# Patient Record
Sex: Male | Born: 1950 | Race: White | Hispanic: No | Smoking: Current every day smoker
Health system: Southern US, Community
[De-identification: ages and names within clinical notes are randomized; demographics above are authoritative.]

## PROBLEM LIST (undated history)

## (undated) DIAGNOSIS — I872 Venous insufficiency (chronic) (peripheral): Secondary | ICD-10-CM

## (undated) DIAGNOSIS — E785 Hyperlipidemia, unspecified: Secondary | ICD-10-CM

## (undated) DIAGNOSIS — I2510 Atherosclerotic heart disease of native coronary artery without angina pectoris: Secondary | ICD-10-CM

## (undated) DIAGNOSIS — I509 Heart failure, unspecified: Secondary | ICD-10-CM

## (undated) DIAGNOSIS — D649 Anemia, unspecified: Secondary | ICD-10-CM

## (undated) DIAGNOSIS — Z9889 Other specified postprocedural states: Secondary | ICD-10-CM

## (undated) DIAGNOSIS — E119 Type 2 diabetes mellitus without complications: Secondary | ICD-10-CM

## (undated) DIAGNOSIS — R112 Nausea with vomiting, unspecified: Secondary | ICD-10-CM

## (undated) DIAGNOSIS — K219 Gastro-esophageal reflux disease without esophagitis: Secondary | ICD-10-CM

## (undated) DIAGNOSIS — F419 Anxiety disorder, unspecified: Secondary | ICD-10-CM

## (undated) DIAGNOSIS — J189 Pneumonia, unspecified organism: Secondary | ICD-10-CM

## (undated) DIAGNOSIS — I739 Peripheral vascular disease, unspecified: Secondary | ICD-10-CM

## (undated) DIAGNOSIS — I219 Acute myocardial infarction, unspecified: Secondary | ICD-10-CM

## (undated) DIAGNOSIS — G709 Myoneural disorder, unspecified: Secondary | ICD-10-CM

## (undated) DIAGNOSIS — I10 Essential (primary) hypertension: Secondary | ICD-10-CM

## (undated) HISTORY — DX: Essential (primary) hypertension: I10

## (undated) HISTORY — DX: Hyperlipidemia, unspecified: E78.5

## (undated) HISTORY — PX: ROTATOR CUFF REPAIR: SHX139

## (undated) HISTORY — DX: Venous insufficiency (chronic) (peripheral): I87.2

## (undated) HISTORY — PX: APPENDECTOMY: SHX54

## (undated) HISTORY — PX: CORONARY ANGIOPLASTY: SHX604

## (undated) HISTORY — DX: Type 2 diabetes mellitus without complications: E11.9

## (undated) HISTORY — DX: Heart failure, unspecified: I50.9

---

## 2006-11-23 HISTORY — PX: CORONARY ARTERY BYPASS GRAFT: SHX141

## 2007-12-14 ENCOUNTER — Ambulatory Visit: Payer: Self-pay | Admitting: Thoracic Surgery (Cardiothoracic Vascular Surgery)

## 2008-01-31 ENCOUNTER — Ambulatory Visit: Payer: Self-pay | Admitting: Thoracic Surgery (Cardiothoracic Vascular Surgery)

## 2008-03-06 ENCOUNTER — Ambulatory Visit: Payer: Self-pay | Admitting: Thoracic Surgery (Cardiothoracic Vascular Surgery)

## 2014-06-21 ENCOUNTER — Other Ambulatory Visit: Payer: Self-pay | Admitting: *Deleted

## 2014-06-21 DIAGNOSIS — R0989 Other specified symptoms and signs involving the circulatory and respiratory systems: Secondary | ICD-10-CM

## 2014-06-22 ENCOUNTER — Encounter: Payer: Self-pay | Admitting: Surgery

## 2014-06-23 ENCOUNTER — Emergency Department (HOSPITAL_COMMUNITY)
Admission: EM | Admit: 2014-06-23 | Discharge: 2014-06-23 | Disposition: A | Discharge status: Home / Self Care | Payer: Medicare HMO | Attending: Emergency Medicine | Admitting: Emergency Medicine

## 2014-06-23 ENCOUNTER — Encounter (HOSPITAL_COMMUNITY): Payer: Self-pay | Admitting: Emergency Medicine

## 2014-06-23 DIAGNOSIS — I739 Peripheral vascular disease, unspecified: Secondary | ICD-10-CM

## 2014-06-23 DIAGNOSIS — E785 Hyperlipidemia, unspecified: Secondary | ICD-10-CM | POA: Diagnosis not present

## 2014-06-23 DIAGNOSIS — M79609 Pain in unspecified limb: Secondary | ICD-10-CM | POA: Insufficient documentation

## 2014-06-23 DIAGNOSIS — F17200 Nicotine dependence, unspecified, uncomplicated: Secondary | ICD-10-CM | POA: Diagnosis not present

## 2014-06-23 DIAGNOSIS — I743 Embolism and thrombosis of arteries of the lower extremities: Secondary | ICD-10-CM | POA: Insufficient documentation

## 2014-06-23 DIAGNOSIS — R21 Rash and other nonspecific skin eruption: Secondary | ICD-10-CM | POA: Diagnosis not present

## 2014-06-23 DIAGNOSIS — Z9861 Coronary angioplasty status: Secondary | ICD-10-CM | POA: Insufficient documentation

## 2014-06-23 DIAGNOSIS — E119 Type 2 diabetes mellitus without complications: Secondary | ICD-10-CM | POA: Diagnosis not present

## 2014-06-23 DIAGNOSIS — I8310 Varicose veins of unspecified lower extremity with inflammation: Secondary | ICD-10-CM | POA: Diagnosis not present

## 2014-06-23 DIAGNOSIS — I7090 Unspecified atherosclerosis: Secondary | ICD-10-CM

## 2014-06-23 DIAGNOSIS — I2510 Atherosclerotic heart disease of native coronary artery without angina pectoris: Secondary | ICD-10-CM | POA: Insufficient documentation

## 2014-06-23 DIAGNOSIS — I509 Heart failure, unspecified: Secondary | ICD-10-CM | POA: Insufficient documentation

## 2014-06-23 DIAGNOSIS — Z79899 Other long term (current) drug therapy: Secondary | ICD-10-CM | POA: Insufficient documentation

## 2014-06-23 DIAGNOSIS — I771 Stricture of artery: Secondary | ICD-10-CM

## 2014-06-23 DIAGNOSIS — I10 Essential (primary) hypertension: Secondary | ICD-10-CM | POA: Diagnosis not present

## 2014-06-23 LAB — CBC WITH DIFFERENTIAL/PLATELET
BASOS PCT: 1 % (ref 0–1)
Basophils Absolute: 0.1 10*3/uL (ref 0.0–0.1)
EOS ABS: 0.1 10*3/uL (ref 0.0–0.7)
Eosinophils Relative: 2 % (ref 0–5)
HCT: 42.3 % (ref 39.0–52.0)
Hemoglobin: 14.7 g/dL (ref 13.0–17.0)
LYMPHS ABS: 1.4 10*3/uL (ref 0.7–4.0)
Lymphocytes Relative: 23 % (ref 12–46)
MCH: 31.3 pg (ref 26.0–34.0)
MCHC: 34.8 g/dL (ref 30.0–36.0)
MCV: 90.2 fL (ref 78.0–100.0)
Monocytes Absolute: 0.4 10*3/uL (ref 0.1–1.0)
Monocytes Relative: 7 % (ref 3–12)
Neutro Abs: 4.2 10*3/uL (ref 1.7–7.7)
Neutrophils Relative %: 67 % (ref 43–77)
PLATELETS: 207 10*3/uL (ref 150–400)
RBC: 4.69 MIL/uL (ref 4.22–5.81)
RDW: 13.6 % (ref 11.5–15.5)
WBC: 6.2 10*3/uL (ref 4.0–10.5)

## 2014-06-23 LAB — BASIC METABOLIC PANEL
Anion gap: 13 (ref 5–15)
BUN: 17 mg/dL (ref 6–23)
CALCIUM: 9.1 mg/dL (ref 8.4–10.5)
CO2: 24 mEq/L (ref 19–32)
Chloride: 97 mEq/L (ref 96–112)
Creatinine, Ser: 1.25 mg/dL (ref 0.50–1.35)
GFR calc Af Amer: 70 mL/min — ABNORMAL LOW (ref >90)
GFR calc non Af Amer: 60 mL/min — ABNORMAL LOW (ref >90)
GLUCOSE: 239 mg/dL — AB (ref 70–99)
Potassium: 4 mEq/L (ref 3.7–5.3)
SODIUM: 134 meq/L — AB (ref 137–147)

## 2014-06-23 LAB — I-STAT CG4 LACTIC ACID, ED: Lactic Acid, Venous: 1.05 mmol/L (ref 0.5–2.2)

## 2014-06-23 MED ORDER — OXYCODONE-ACETAMINOPHEN 10-325 MG PO TABS: 0.5000 | ORAL_TABLET | ORAL | Status: DC | PRN

## 2014-06-23 NOTE — ED Provider Notes (Signed)
Plains of right leg and foot pain for several weeks. Worse with walking. Improved with rest. Recently treated for cellulitis. On exam patient is alert nontoxic. Chronically ill-appearing. Right lower extremity reddened, not warm or cool in comparison to contralateral leg.dp and pt pulses absent bilaterally. Vascular surgery consultation called.Dr [name] saw pt in the ED And made arragements for further outpt evaulaution  Doug SouSam Brylinn Teaney, MD 06/23/14 1046

## 2014-06-23 NOTE — ED Provider Notes (Signed)
CSN: [id number]     Arrival date & time 06/23/14  0830 History   First MD Initiated Contact with Patient 06/23/14 0840     Chief Complaint  Patient presents with  . Foot Pain     (Consider location/radiation/quality/duration/timing/severity/associated sxs/prior Treatment) HPI  Brent Patterson is a(n) 63 y.o. male who presents to the ED with cc right foot pain. The patient has a past medical history of diabetes, hypertension, hyperlipidemia, coronary artery disease who is status post stent placement. The patient complains of pain in the right foot. He states that for the past 2 months she's been treated for cellulitis with 4 rounds of antibiotics. The patient states that 2 months ago he cut then toenail of his right great toe and had the development of swelling, pain, heat, redness. He was seen at [facility] and treated for cellulitis then. He he states that they did do a study to look for clot that was negative. Since that time he has had worsening pain in the legs bilaterally however the right is worse. He has a history of pain with walking that is relieved with rest. The patient states that over the past month it has gotten significantly worse. He states that he used to be able to walk around his house with a cane and only have to stop intermittently for relief. He states that now he has to stop halfway down his hall to rest the chest pain. The patient was seen on 06/19/2014) ER again and diagnosed with venous stasis dermatitis. He is asked to followup with vascular surgery. The patient states he has been taking hydrocodone with moderate relief of his pain. He states that this morning at 3 AM he awoke to severe pain and in the right foot. Patient states he took one of his hydrocodone however it did not help with his pain. He has decreased sensation due to diabetic neuropathy.  Current Daily Smoker Denies fevers, chills, myalgias, arthralgias. Denies DOE, SOB, chest tightness or pressure,  radiation to left arm, jaw or back, or diaphoresis. Denies dysuria, flank pain, suprapubic pain, frequency, urgency, or hematuria. Denies headaches, light headedness, weakness, visual disturbances. Denies abdominal pain, nausea, vomiting, diarrhea or constipation.   Past Medical History  Diagnosis Date  . Diabetes mellitus without complication   . Hypertension   . Hyperlipidemia   . CHF (congestive heart failure)   . Dermatitis, stasis    History reviewed. No pertinent past surgical history. No family history on file. History  Substance Use Topics  . Smoking status: Current Every Day Smoker    Types: Cigarettes  . Smokeless tobacco: Not on file  . Alcohol Use: No    Review of Systems  Ten systems reviewed and are negative for acute change, except as noted in the HPI.    Allergies  Codeine  Home Medications   Prior to Admission medications   Medication Sig Start Date End Date Taking? Authorizing Provider  amLODipine (NORVASC) 10 MG tablet Take 10 mg by mouth every morning.    Yes Historical Provider, MD  escitalopram (LEXAPRO) 10 MG tablet Take 10 mg by mouth every evening.    Yes Historical Provider, MD  gabapentin (NEURONTIN) 100 MG capsule Take 200 mg by mouth 2 (two) times daily.   Yes Historical Provider, MD  glimepiride (AMARYL) 2 MG tablet Take 1 mg by mouth every evening.   Yes Historical Provider, MD  HYDROcodone-acetaminophen (NORCO/VICODIN) 5-325 MG per tablet Take 1 tablet by mouth every 4 (four)  hours as needed for moderate pain.   Yes Historical Provider, MD  lisinopril (PRINIVIL,ZESTRIL) 20 MG tablet Take 20 mg by mouth every evening.    Yes Historical Provider, MD  nitroGLYCERIN (NITROSTAT) 0.4 MG SL tablet Place 0.4 mg under the tongue every 5 (five) minutes as needed for chest pain.   Yes Historical Provider, MD  pravastatin (PRAVACHOL) 40 MG tablet Take 40 mg by mouth every evening.    Yes Historical Provider, MD  ranitidine (ZANTAC) 150 MG tablet Take 150  mg by mouth daily as needed for heartburn.    Yes Historical Provider, MD  sitaGLIPtin (JANUVIA) 100 MG tablet Take 50 mg by mouth every evening.   Yes Historical Provider, MD   BP 134/82  Pulse 113  Temp(Src) 98 F (36.7 C) (Oral)  Resp 16  SpO2 100% Physical Exam  Nursing note and vitals reviewed. Constitutional: No distress.  Appears olders than stated age.   HENT:  Head: Normocephalic and atraumatic.  Eyes: Conjunctivae are normal. No scleral icterus.  Neck: Normal range of motion. Neck supple.  Cardiovascular: Normal rate, regular rhythm and normal heart sounds.   Right and left lower extremity examination without palpable pulses. Doppler reveals a intermittent faint monophasic right foot dorsalis pedis pulse. No other pulses are audible with Doppler examination. Right foot shows a petechial rash which coalesces into for reddened discoloration of the right foot. No temperature differential is noted. Patient is able to move his toes. Capillary refill of the right foot is greater than 3 seconds. Sensation is poor. No palpable popliteal pulses bilaterally appear.  Pulmonary/Chest: Effort normal. No respiratory distress.  Abdominal: Soft. There is no tenderness.  Musculoskeletal: He exhibits no edema.  Neurological: He is alert.  Skin: Skin is warm and dry. Rash noted. He is not diaphoretic.  Psychiatric: His behavior is normal.    ED Course  Procedures (including critical care time) Labs Review Labs Reviewed  CBC WITH DIFFERENTIAL  BASIC METABOLIC PANEL  I-STAT CG4 LACTIC ACID, ED    Imaging Review No results found.   EKG Interpretation None      MDM   Final diagnoses:  None    9:49 AM BP 134/82  Pulse 113  Temp(Src) 98 F (36.7 C) (Oral)  Resp 16  SpO2 100% GU with complaint of right leg pain. Examination and visit with Dr. Rennis Chris. I have ordered a release of his medical records from [facility]. Pain medication and labs ordered. Also place a  consult with the vascular surgeons. Patient states that he did speak with the staff at Dr. Estanislado Spire office 2 days ago and was told to have it evaluated immediately because he was unable to get an appointment until August 31. Patient states he had to get a ride to come here.    10:31 AM BP 134/82  Pulse 113  Temp(Src) 98 F (36.7 C) (Oral)  Resp 16  SpO2 100% Seen hereby Dr. Darrick Penna. The patient will complete his vasc US   Filed Vitals:   06/23/14 0913 06/23/14 1043 06/23/14 1100 06/23/14 1145  BP: 134/82 153/74 158/66 133/61  Pulse: 113 103 82 84  Temp: 98 F (36.7 C)     TempSrc: Oral     Resp: 16 16    SpO2: 100% 100% 99% 99%     Patient with complete occlusion of the arterial system from the Common Illiac  Distally. Patient has a scheduled arteriogram with Dr. Myra Gianotti on Tuesday. D/c with percocet.    Cammy Copa  Tiburcio Pea, PA-C 06/23/14 1918

## 2014-06-23 NOTE — Discharge Instructions (Signed)

## 2014-06-23 NOTE — ED Notes (Signed)
Pt. Stated, I have rt. Foot pain for 2 months and they've been treating me for cellulitis, and Im suppose to see vein specialist on the 31st of August.

## 2014-06-23 NOTE — Progress Notes (Signed)
VASCULAR LAB PRELIMINARY  ARTERIAL  ABI completed:    RIGHT    LEFT    PRESSURE WAVEFORM  PRESSURE WAVEFORM  BRACHIAL 188 triphasic BRACHIAL    DP  absent DP    AT  absent AT 80 monophasic  PT  absent PT 89 monophasic  PER  absent PER    GREAT TOE  flat GREAT TOE  flat    RIGHT LEFT  ABI N/A 0.47   Duplex imaging:  Right:  The common femoral, femoral, popliteal, peroneal, and posterior tibial arteries appear occluded.  Left:  The femoral artery appears occluded proximally with reconstitution in the popliteal artery.    Biviana Saddler, RVT 06/23/2014, 11:36 AM

## 2014-06-23 NOTE — ED Notes (Signed)
Lactic acid results given to Harris, PA-C 

## 2014-06-23 NOTE — Consult Note (Signed)
[facility] [place] HISTORY AND PHYSICAL  Reason for consult: right foot pain Requesting: [place] Jacobowitz History of Present Illness:  Patient is a 63 y.o. year old male who presents for evaluation of pain right foot. The pain has been present for approximately 2 months.  He has had some relief with Vicodin.  He has been seen several times at [facility] and treated for cellulitis with antibiotics with no improvement.  He has a long standing history of cramping in the right leg with walking which improves with rest.  He has no history of non healing wounds.  He has some numbness and tingling both feet R > L.  Risk factors include coronary artery disease (CABG 2008 right leg vein), diabetes >10 years, hypertension, +smoking (1.5 ppd) hyperlipidemia, CHF.  These problems are currently stable.  Greater than 3 minutes spent regarding smoking cessation counseling.    Past Medical History  Diagnosis Date  . Diabetes mellitus without complication   . Hypertension   . Hyperlipidemia   . CHF (congestive heart failure)   . Dermatitis, stasis     History reviewed. No pertinent past surgical history.  Social History History  Substance Use Topics  . Smoking status: Current Every Day Smoker    Types: Cigarettes  . Smokeless tobacco: Not on file  . Alcohol Use: No    Family History No family history on file.  Allergies  Allergies  Allergen Reactions  . Codeine Other (See Comments)    Chills "Made me sick to my stomach"     No current facility-administered medications for this encounter.   Current Outpatient Prescriptions  Medication Sig Dispense Refill  . amLODipine (NORVASC) 10 MG tablet Take 10 mg by mouth every morning.       . escitalopram (LEXAPRO) 10 MG tablet Take 10 mg by mouth every evening.       . gabapentin (NEURONTIN) 100 MG capsule Take 200 mg by mouth 2 (two) times daily.      Marland Kitchen glimepiride (AMARYL) 2 MG tablet Take 1 mg by mouth every  evening.      Marland Kitchen HYDROcodone-acetaminophen (NORCO/VICODIN) 5-325 MG per tablet Take 1 tablet by mouth every 4 (four) hours as needed for moderate pain.      Marland Kitchen lisinopril (PRINIVIL,ZESTRIL) 20 MG tablet Take 20 mg by mouth every evening.       . nitroGLYCERIN (NITROSTAT) 0.4 MG SL tablet Place 0.4 mg under the tongue every 5 (five) minutes as needed for chest pain.      . pravastatin (PRAVACHOL) 40 MG tablet Take 40 mg by mouth every evening.       . ranitidine (ZANTAC) 150 MG tablet Take 150 mg by mouth daily as needed for heartburn.       . sitaGLIPtin (JANUVIA) 100 MG tablet Take 50 mg by mouth every evening.        ROS:   General:  No weight loss, Fever, chills  HEENT: No recent headaches, no nasal bleeding, no visual changes, no sore throat  Neurologic: No dizziness, blackouts, seizures. No recent symptoms of stroke or mini- stroke. No recent episodes of slurred speech, or temporary blindness.  Cardiac: No recent episodes of chest pain/pressure, no shortness of breath at rest.  +shortness of breath with exertion.  Denies history of atrial fibrillation or irregular heartbeat  Vascular: + history of rest pain in feet.  + history of claudication.  No history of non-healing ulcer, No history of DVT  Pulmonary: No home oxygen, no productive cough, no hemoptysis,  No asthma or wheezing  Musculoskeletal:  [ ]  Arthritis, [ ]  Low back pain,  [ ]  Joint pain  Hematologic:No history of hypercoagulable state.  No history of easy bleeding.  No history of anemia  Gastrointestinal: No hematochezia or melena,  No gastroesophageal reflux, no trouble swallowing  Urinary: [ ]  chronic Kidney disease, [ ]  on HD - [ ]  MWF or [ ]  TTHS, [ ]  Burning with urination, [ ]  Frequent urination, [ ]  Difficulty urinating;   Skin: No rashes  Psychological: No history of anxiety,  No history of depression   Physical Examination  Filed Vitals:   06/23/14 0837 06/23/14 0913  BP: 177/93 134/82  Pulse: 115 113   Temp: 97.2 F (36.2 C) 98 F (36.7 C)  TempSrc: Oral Oral  Resp: 22 16  SpO2: 97% 100%    There is no height or weight on file to calculate BMI.  General:  Alert and oriented, no acute distress HEENT: Normal Neck: No JVD, 2+ carotid pulses Pulmonary: non labored breathing Cardiac: Regular Rate and Rhythm Abdomen: Soft, non-tender, non-distended, no mass Skin: petechial rash right foot extending to ankle reddish color, brawny staining left gaiter area Extremity Pulses:  2+ radial, brachial, absent right femoral 2+ left femoral, absent popliteal dorsalis pedis, posterior tibial pulses bilaterally Musculoskeletal: No deformity or edema  Neurologic: Upper and lower extremity motor 5/5 and symmetric  DATA:  ABI arterial duplex pending  CBC    Component Value Date/Time   WBC 6.2 06/23/2014 0929   RBC 4.69 06/23/2014 0929   HGB 14.7 06/23/2014 0929   HCT 42.3 06/23/2014 0929   PLT 207 06/23/2014 0929   MCV 90.2 06/23/2014 0929   MCH 31.3 06/23/2014 0929   MCHC 34.8 06/23/2014 0929   RDW 13.6 06/23/2014 0929   LYMPHSABS 1.4 06/23/2014 0929   MONOABS 0.4 06/23/2014 0929   EOSABS 0.1 06/23/2014 0929   BASOSABS 0.1 06/23/2014 0929    BMET    Component Value Date/Time   NA 134* 06/23/2014 0929   K 4.0 06/23/2014 0929   CL 97 06/23/2014 0929   CO2 24 06/23/2014 0929   GLUCOSE 239* 06/23/2014 0929   BUN 17 06/23/2014 0929   CREATININE 1.25 06/23/2014 0929   CALCIUM 9.1 06/23/2014 0929   GFRNONAA 60* 06/23/2014 0929   GFRAA 70* 06/23/2014 0929    ASSESSMENT:  Peripheral arterial disease most likely right iliac artery occlusion by exam most likely SFA and tibial disease as well.  Although perfusion is poor this is a chronic not acute presentation.  Will schedule for aortogram with lower extremity runoff possible intervention by my partner Dr Myra GianottiBrabham on Tuesday August 4.  Our office will contact him Monday to discuss arrival times.  Can be dc'd home today after ABI and arterial doppler completed.  Will need some  additional pain medication to last until arteriogram   PLAN:  See above  Fabienne Brunsharles Nickolaus Bordelon, MD [facility] Office: [phone] Pager: [phone]

## 2014-06-24 NOTE — ED Provider Notes (Signed)
Medical screening examination/treatment/procedure(s) were conducted as a shared visit with non-physician practitioner(s) and myself.  I personally evaluated the patient during the encounter.   EKG Interpretation None       Garo Heidelberg, MD 06/24/14 0701 

## 2014-06-25 ENCOUNTER — Other Ambulatory Visit: Payer: Self-pay

## 2014-06-26 ENCOUNTER — Encounter (HOSPITAL_COMMUNITY)
Admission: RE | Disposition: A | Discharge status: Home / Self Care | Payer: Self-pay | Source: Ambulatory Visit | Attending: Surgery

## 2014-06-26 ENCOUNTER — Ambulatory Visit (HOSPITAL_COMMUNITY)
Admission: RE | Admit: 2014-06-26 | Discharge: 2014-06-26 | Disposition: A | Discharge status: Home / Self Care | Payer: Medicare HMO | Source: Ambulatory Visit | Attending: Surgery | Admitting: Surgery

## 2014-06-26 DIAGNOSIS — I509 Heart failure, unspecified: Secondary | ICD-10-CM | POA: Diagnosis not present

## 2014-06-26 DIAGNOSIS — E119 Type 2 diabetes mellitus without complications: Secondary | ICD-10-CM | POA: Insufficient documentation

## 2014-06-26 DIAGNOSIS — E785 Hyperlipidemia, unspecified: Secondary | ICD-10-CM | POA: Insufficient documentation

## 2014-06-26 DIAGNOSIS — I2510 Atherosclerotic heart disease of native coronary artery without angina pectoris: Secondary | ICD-10-CM | POA: Insufficient documentation

## 2014-06-26 DIAGNOSIS — I10 Essential (primary) hypertension: Secondary | ICD-10-CM | POA: Insufficient documentation

## 2014-06-26 DIAGNOSIS — F17200 Nicotine dependence, unspecified, uncomplicated: Secondary | ICD-10-CM | POA: Diagnosis not present

## 2014-06-26 DIAGNOSIS — Z951 Presence of aortocoronary bypass graft: Secondary | ICD-10-CM | POA: Insufficient documentation

## 2014-06-26 DIAGNOSIS — Z79899 Other long term (current) drug therapy: Secondary | ICD-10-CM | POA: Diagnosis not present

## 2014-06-26 DIAGNOSIS — I70219 Atherosclerosis of native arteries of extremities with intermittent claudication, unspecified extremity: Secondary | ICD-10-CM | POA: Diagnosis not present

## 2014-06-26 DIAGNOSIS — I739 Peripheral vascular disease, unspecified: Secondary | ICD-10-CM | POA: Diagnosis present

## 2014-06-26 DIAGNOSIS — I701 Atherosclerosis of renal artery: Secondary | ICD-10-CM | POA: Insufficient documentation

## 2014-06-26 HISTORY — PX: ABDOMINAL AORTAGRAM: SHX5454

## 2014-06-26 LAB — POCT I-STAT, CHEM 8
BUN: 14 mg/dL (ref 6–23)
CREATININE: 1.3 mg/dL (ref 0.50–1.35)
Calcium, Ion: 1.19 mmol/L (ref 1.13–1.30)
Chloride: 99 mEq/L (ref 96–112)
Glucose, Bld: 142 mg/dL — ABNORMAL HIGH (ref 70–99)
HCT: 45 % (ref 39.0–52.0)
Hemoglobin: 15.3 g/dL (ref 13.0–17.0)
Potassium: 4.2 mEq/L (ref 3.7–5.3)
Sodium: 139 mEq/L (ref 137–147)
TCO2: 24 mmol/L (ref 0–100)

## 2014-06-26 LAB — GLUCOSE, CAPILLARY: Glucose-Capillary: 125 mg/dL — ABNORMAL HIGH (ref 70–99)

## 2014-06-26 SURGERY — ABDOMINAL AORTAGRAM
Anesthesia: LOCAL

## 2014-06-26 MED ORDER — MIDAZOLAM HCL 2 MG/2ML IJ SOLN
INTRAMUSCULAR | Status: AC
  Filled 2014-06-26: qty 2

## 2014-06-26 MED ORDER — SODIUM CHLORIDE 0.9 % IV SOLN: 1.0000 mL/kg/h | INTRAVENOUS | Status: DC

## 2014-06-26 MED ORDER — HYDRALAZINE HCL 20 MG/ML IJ SOLN: 10.0000 mg | INTRAMUSCULAR | Status: DC | PRN

## 2014-06-26 MED ORDER — ALUM & MAG HYDROXIDE-SIMETH 200-200-20 MG/5ML PO SUSP: 15.0000 mL | ORAL | Status: DC | PRN

## 2014-06-26 MED ORDER — GUAIFENESIN-DM 100-10 MG/5ML PO SYRP: 15.0000 mL | ORAL_SOLUTION | ORAL | Status: DC | PRN

## 2014-06-26 MED ORDER — FENTANYL CITRATE 0.05 MG/ML IJ SOLN
INTRAMUSCULAR | Status: AC
  Filled 2014-06-26: qty 2

## 2014-06-26 MED ORDER — OXYCODONE HCL 5 MG PO TABS
5.0000 mg | ORAL_TABLET | ORAL | Status: DC | PRN
  Administered 2014-06-26: 5 mg via ORAL

## 2014-06-26 MED ORDER — ACETAMINOPHEN 325 MG PO TABS: 325.0000 mg | ORAL_TABLET | ORAL | Status: DC | PRN

## 2014-06-26 MED ORDER — LIDOCAINE HCL (PF) 1 % IJ SOLN
INTRAMUSCULAR | Status: AC
  Filled 2014-06-26: qty 30

## 2014-06-26 MED ORDER — SODIUM CHLORIDE 0.9 % IV SOLN
INTRAVENOUS | Status: DC
  Administered 2014-06-26: 06:00:00 via INTRAVENOUS

## 2014-06-26 MED ORDER — MORPHINE SULFATE 10 MG/ML IJ SOLN
2.0000 mg | INTRAMUSCULAR | Status: DC | PRN
  Administered 2014-06-26: 2 mg via INTRAVENOUS

## 2014-06-26 MED ORDER — ONDANSETRON HCL 4 MG/2ML IJ SOLN
INTRAMUSCULAR | Status: AC
  Filled 2014-06-26: qty 2

## 2014-06-26 MED ORDER — LABETALOL HCL 5 MG/ML IV SOLN: 10.0000 mg | INTRAVENOUS | Status: DC | PRN

## 2014-06-26 MED ORDER — METOPROLOL TARTRATE 1 MG/ML IV SOLN: 2.0000 mg | INTRAVENOUS | Status: DC | PRN

## 2014-06-26 MED ORDER — HEPARIN (PORCINE) IN NACL 2-0.9 UNIT/ML-% IJ SOLN
INTRAMUSCULAR | Status: AC
  Filled 2014-06-26: qty 1000

## 2014-06-26 MED ORDER — MORPHINE SULFATE 2 MG/ML IJ SOLN
INTRAMUSCULAR | Status: AC
  Filled 2014-06-26: qty 1

## 2014-06-26 MED ORDER — ACETAMINOPHEN 325 MG RE SUPP: 325.0000 mg | RECTAL | Status: DC | PRN

## 2014-06-26 MED ORDER — ONDANSETRON HCL 4 MG/2ML IJ SOLN
4.0000 mg | Freq: Once | INTRAMUSCULAR | Status: AC
  Administered 2014-06-26: 4 mg via INTRAVENOUS

## 2014-06-26 MED ORDER — ONDANSETRON HCL 4 MG/2ML IJ SOLN: 4.0000 mg | Freq: Four times a day (QID) | INTRAMUSCULAR | Status: DC | PRN

## 2014-06-26 MED ORDER — OXYCODONE HCL 5 MG PO TABS
ORAL_TABLET | ORAL | Status: AC
  Filled 2014-06-26: qty 1

## 2014-06-26 MED ORDER — PHENOL 1.4 % MT LIQD: 1.0000 | OROMUCOSAL | Status: DC | PRN

## 2014-06-26 SURGICAL SUPPLY — 53 items
BANDAGE ELASTIC 4 VELCRO ST LF (GAUZE/BANDAGES/DRESSINGS) IMPLANT
BANDAGE ESMARK 6X9 LF (GAUZE/BANDAGES/DRESSINGS) IMPLANT
BNDG ESMARK 6X9 LF (GAUZE/BANDAGES/DRESSINGS)
CANISTER SUCTION 2500CC (MISCELLANEOUS) ×3 IMPLANT
CLIP TI MEDIUM 24 (CLIP) ×3 IMPLANT
CLIP TI WIDE RED SMALL 24 (CLIP) ×3 IMPLANT
COVER SURGICAL LIGHT HANDLE (MISCELLANEOUS) ×3 IMPLANT
CUFF TOURNIQUET SINGLE 24IN (TOURNIQUET CUFF) IMPLANT
CUFF TOURNIQUET SINGLE 34IN LL (TOURNIQUET CUFF) IMPLANT
CUFF TOURNIQUET SINGLE 44IN (TOURNIQUET CUFF) IMPLANT
DERMABOND ADVANCED (GAUZE/BANDAGES/DRESSINGS) ×1
DERMABOND ADVANCED .7 DNX12 (GAUZE/BANDAGES/DRESSINGS) ×2 IMPLANT
DRAIN CHANNEL 15F RND FF W/TCR (WOUND CARE) IMPLANT
DRAPE WARM FLUID 44X44 (DRAPE) ×3 IMPLANT
DRAPE X-RAY CASS 24X20 (DRAPES) IMPLANT
DRSG COVADERM 4X10 (GAUZE/BANDAGES/DRESSINGS) IMPLANT
DRSG COVADERM 4X8 (GAUZE/BANDAGES/DRESSINGS) IMPLANT
ELECT REM PT RETURN 9FT ADLT (ELECTROSURGICAL) ×3
ELECTRODE REM PT RTRN 9FT ADLT (ELECTROSURGICAL) ×2 IMPLANT
EVACUATOR SILICONE 100CC (DRAIN) IMPLANT
GLOVE BIOGEL PI IND STRL 7.5 (GLOVE) ×2 IMPLANT
GLOVE BIOGEL PI INDICATOR 7.5 (GLOVE) ×1
GLOVE SURG SS PI 7.5 STRL IVOR (GLOVE) ×3 IMPLANT
GOWN PREVENTION PLUS XXLARGE (GOWN DISPOSABLE) ×3 IMPLANT
GOWN STRL NON-REIN LRG LVL3 (GOWN DISPOSABLE) ×9 IMPLANT
HEMOSTAT SNOW SURGICEL 2X4 (HEMOSTASIS) IMPLANT
KIT BASIN OR (CUSTOM PROCEDURE TRAY) ×3 IMPLANT
KIT ROOM TURNOVER OR (KITS) ×3 IMPLANT
MARKER GRAFT CORONARY BYPASS (MISCELLANEOUS) IMPLANT
NS IRRIG 1000ML POUR BTL (IV SOLUTION) ×6 IMPLANT
PACK PERIPHERAL VASCULAR (CUSTOM PROCEDURE TRAY) ×3 IMPLANT
PAD ARMBOARD 7.5X6 YLW CONV (MISCELLANEOUS) ×6 IMPLANT
PADDING CAST COTTON 6X4 STRL (CAST SUPPLIES) IMPLANT
SET COLLECT BLD 21X3/4 12 (NEEDLE) IMPLANT
STOPCOCK 4 WAY LG BORE MALE ST (IV SETS) IMPLANT
SUT ETHILON 3 0 PS 1 (SUTURE) IMPLANT
SUT PROLENE 5 0 C 1 24 (SUTURE) ×3 IMPLANT
SUT PROLENE 6 0 BV (SUTURE) ×3 IMPLANT
SUT PROLENE 7 0 BV 1 (SUTURE) IMPLANT
SUT SILK 2 0 SH (SUTURE) ×3 IMPLANT
SUT SILK 3 0 (SUTURE)
SUT SILK 3-0 18XBRD TIE 12 (SUTURE) IMPLANT
SUT VIC AB 2-0 CT1 27 (SUTURE) ×2
SUT VIC AB 2-0 CT1 TAPERPNT 27 (SUTURE) ×4 IMPLANT
SUT VIC AB 3-0 SH 27 (SUTURE) ×2
SUT VIC AB 3-0 SH 27X BRD (SUTURE) ×4 IMPLANT
SUT VICRYL 4-0 PS2 18IN ABS (SUTURE) ×6 IMPLANT
TOWEL OR 17X24 6PK STRL BLUE (TOWEL DISPOSABLE) ×6 IMPLANT
TOWEL OR 17X26 10 PK STRL BLUE (TOWEL DISPOSABLE) ×6 IMPLANT
TRAY FOLEY CATH 16FRSI W/METER (SET/KITS/TRAYS/PACK) ×3 IMPLANT
TUBING EXTENTION W/L.L. (IV SETS) IMPLANT
UNDERPAD 30X30 INCONTINENT (UNDERPADS AND DIAPERS) ×3 IMPLANT
WATER STERILE IRR 1000ML POUR (IV SOLUTION) ×3 IMPLANT

## 2014-06-26 NOTE — Interval H&P Note (Signed)
History and Physical Interval Note:  06/26/2014 7:06 AM  Brent Patterson  has presented today for surgery, with the diagnosis of pvd  The various methods of treatment have been discussed with the patient and family. After consideration of risks, benefits and other options for treatment, the patient has consented to  Procedure(s): ABDOMINAL AORTAGRAM (N/A) as a surgical intervention .  The patient's history has been reviewed, patient examined, no change in status, stable for surgery.  I have reviewed the patient's chart and labs.  Questions were answered to the patient's satisfaction.     Ashwath Lasch IV, V. WELLS

## 2014-06-26 NOTE — Op Note (Signed)
    Patient name: Brent ClaymanHoward G Violante MRN: [id number] DOB: 1951-09-22 Sex: male  06/26/2014 Pre-operative Diagnosis: Bilateral claudication, right greater than left Post-operative diagnosis:  Same Surgeon:  Jorge NyBRABHAM IV, V. WELLS Procedure Performed:  1.  ultrasound-guided access, left femoral artery  2.  abdominal aortogram  3.  bilateral lower extremity runoff    Indications:  Patient comes in with severe pain in his bilateral feet, right greater than left.  He is here for a diagnostic angiogram and possible intervention.  Procedure:  The patient was identified in the holding area and taken to room 8.  The patient was then placed supine on the table and prepped and draped in the usual sterile fashion.  A time out was called.  Ultrasound was used to evaluate the left common femoral artery.  It was patent .  A digital ultrasound image was acquired.  A micropuncture needle was used to access the left common femoral artery under ultrasound guidance.  An 018 wire was advanced without resistance and a micropuncture sheath was placed.  The 018 wire was removed and a benson wire was placed.  The micropuncture sheath was exchanged for a 5 french sheath.  An omniflush catheter was advanced over the wire to the level of L-1.  An abdominal angiogram was obtained.  Next, the cath was pulled down to the aortic bifurcation and pelvic angiography was performed in multiple obliquities.  This was followed by bilateral lower extremity runoff  Findings:   Aortogram:  Irregular appearance of the juxtarenal abdominal aorta.  There is a high-grade right renal artery stenosis.  The infrarenal abdominal aorta is patent but irregular without significant stenosis.  The right common femoral artery is patent throughout it's course.  There does appear to be a hemodynamically significant stenosis within the right  iliac artery, approximately 60%..  The right external iliac artery is small in caliber.  The left common iliac and external  iliac artery is patent throughout it's course.  Right Lower Extremity:  There is occlusion of the right common femoral artery.  The profunda femoral artery is patent.  The superficial femoral artery is occluded with reconstitution of the distal above-knee popliteal artery, at the level of the patella.  There is 2 vessel runoff via the anterior tibial and peroneal artery.  There is delayed reconstitution of the posterior tibial artery.  Left Lower Extremity:  Significant stenosis is identified within the distal left common femoral artery.  The profunda femoral artery is diseased but patent The superficial femoral artery is occluded with reconstitution of the above-knee popliteal artery and three-vessel runoff.  Intervention:  None  Impression:  #1  high-grade right renal artery stenosis  #2  right common femoral artery occlusion with reconstitution of the distal above-knee popliteal artery and two-vessel runoff via the anterior tibial and peroneal artery  #3  high-grade left common femoral artery stenosis with occluded superficial femoral artery.  There is reconstitution of the above-knee popliteal artery and three-vessel runoff.     [name], M.D. [facility] Office: [phone] Pager:  [phone]

## 2014-06-26 NOTE — Discharge Instructions (Signed)
Angiogram, Care After °Refer to this sheet in the next few weeks. These instructions provide you with information on caring for yourself after your procedure. Your health care provider may also give you more specific instructions. Your treatment has been planned according to current medical practices, but problems sometimes occur. Call your health care provider if you have any problems or questions after your procedure.  °WHAT TO EXPECT AFTER THE PROCEDURE °After your procedure, it is typical to have the following sensations: °· Minor discomfort or tenderness and a small bump at the catheter insertion site. The bump should usually decrease in size and tenderness within 1 to 2 weeks. °· Any bruising will usually fade within 2 to 4 weeks. °HOME CARE INSTRUCTIONS  °· You may need to keep taking blood thinners if they were prescribed for you. Take medicines only as directed by your health care provider. °· Do not apply powder or lotion to the site. °· Do not take baths, swim, or use a hot tub until your health care provider approves. °· You may shower 24 hours after the procedure. Remove the bandage (dressing) and gently wash the site with plain soap and water. Gently pat the site dry. °· Inspect the site at least twice daily. °· Limit your activity for the first 48 hours. Do not bend, squat, or lift anything over 20 lb (9 kg) or as directed by your health care provider. °· Plan to have someone take you home after the procedure. Follow instructions about when you can drive or return to work. °SEEK MEDICAL CARE IF: °· You get light-headed when standing up. °· You have drainage (other than a small amount of blood on the dressing). °· You have chills. °· You have a fever. °· You have redness, warmth, swelling, or pain at the insertion site. °SEEK IMMEDIATE MEDICAL CARE IF:  °· You develop chest pain or shortness of breath, feel faint, or pass out. °· You have bleeding, swelling larger than a walnut, or drainage from the  catheter insertion site. °· You develop pain, discoloration, coldness, or severe bruising in the leg or arm that held the catheter. °· You have heavy bleeding from the site. If this happens, hold pressure on the site and call 911. °MAKE SURE YOU: °· Understand these instructions. °· Will watch your condition. °· Will get help right away if you are not doing well or get worse. °Document Released: 05/28/2005 Document Revised: 03/26/2014 Document Reviewed: 04/03/2013 °[employer]® Patient Information ©2015 [employer]. This information is not intended to replace advice given to you by your health care provider. Make sure you discuss any questions you have with your health care provider. ° °

## 2014-06-26 NOTE — H&P (View-Only) (Signed)
[facility] [place] HISTORY AND PHYSICAL  Reason for consult: right foot pain Requesting: [place] Jacob[place] History of Present Illness:  Patient is a 63 y.o. year old male who presents for evaluation of pain right foot. The pain has been present for approximately 2 months.  He has had some relief with Vicodin.  He has been seen several times at [facility] and treated for cellulitis with antibiotics with no improvement.  He has a long standing history of cramping in the right leg with walking which improves with rest.  He has no history of non healing wounds.  He has some numbness and tingling both feet R > L.  Risk factors include coronary artery disease (CABG 2008 right leg vein), diabetes >10 years, hypertension, +smoking (1.5 ppd) hyperlipidemia, CHF.  These problems are currently stable.  Greater than 3 minutes spent regarding smoking cessation counseling.    Past Medical History  Diagnosis Date  . Diabetes mellitus without complication   . Hypertension   . Hyperlipidemia   . CHF (congestive heart failure)   . Dermatitis, stasis     History reviewed. No pertinent past surgical history.  Social History History  Substance Use Topics  . Smoking status: Current Every Day Smoker    Types: Cigarettes  . Smokeless tobacco: Not on file  . Alcohol Use: No    Family History No family history on file.  Allergies  Allergies  Allergen Reactions  . Codeine Other (See Comments)    Chills "Made me sick to my stomach"     No current facility-administered medications for this encounter.   Current Outpatient Prescriptions  Medication Sig Dispense Refill  . amLODipine (NORVASC) 10 MG tablet Take 10 mg by mouth every morning.       . escitalopram (LEXAPRO) 10 MG tablet Take 10 mg by mouth every evening.       . gabapentin (NEURONTIN) 100 MG capsule Take 200 mg by mouth 2 (two) times daily.      Marland Kitchen glimepiride (AMARYL) 2 MG tablet Take 1 mg by mouth every  evening.      Marland Kitchen HYDROcodone-acetaminophen (NORCO/VICODIN) 5-325 MG per tablet Take 1 tablet by mouth every 4 (four) hours as needed for moderate pain.      Marland Kitchen lisinopril (PRINIVIL,ZESTRIL) 20 MG tablet Take 20 mg by mouth every evening.       . nitroGLYCERIN (NITROSTAT) 0.4 MG SL tablet Place 0.4 mg under the tongue every 5 (five) minutes as needed for chest pain.      . pravastatin (PRAVACHOL) 40 MG tablet Take 40 mg by mouth every evening.       . ranitidine (ZANTAC) 150 MG tablet Take 150 mg by mouth daily as needed for heartburn.       . sitaGLIPtin (JANUVIA) 100 MG tablet Take 50 mg by mouth every evening.        ROS:   General:  No weight loss, Fever, chills  HEENT: No recent headaches, no nasal bleeding, no visual changes, no sore throat  Neurologic: No dizziness, blackouts, seizures. No recent symptoms of stroke or mini- stroke. No recent episodes of slurred speech, or temporary blindness.  Cardiac: No recent episodes of chest pain/pressure, no shortness of breath at rest.  +shortness of breath with exertion.  Denies history of atrial fibrillation or irregular heartbeat  Vascular: + history of rest pain in feet.  + history of claudication.  No history of non-healing ulcer, No history of DVT  Pulmonary: No home oxygen, no productive cough, no hemoptysis,  No asthma or wheezing  Musculoskeletal:  [ ]  Arthritis, [ ]  Low back pain,  [ ]  Joint pain  Hematologic:No history of hypercoagulable state.  No history of easy bleeding.  No history of anemia  Gastrointestinal: No hematochezia or melena,  No gastroesophageal reflux, no trouble swallowing  Urinary: [ ]  chronic Kidney disease, [ ]  on HD - [ ]  MWF or [ ]  TTHS, [ ]  Burning with urination, [ ]  Frequent urination, [ ]  Difficulty urinating;   Skin: No rashes  Psychological: No history of anxiety,  No history of depression   Physical Examination  Filed Vitals:   06/23/14 0837 06/23/14 0913  BP: 177/93 134/82  Pulse: 115 113   Temp: 97.2 F (36.2 C) 98 F (36.7 C)  TempSrc: Oral Oral  Resp: 22 16  SpO2: 97% 100%    There is no height or weight on file to calculate BMI.  General:  Alert and oriented, no acute distress HEENT: Normal Neck: No JVD, 2+ carotid pulses Pulmonary: non labored breathing Cardiac: Regular Rate and Rhythm Abdomen: Soft, non-tender, non-distended, no mass Skin: petechial rash right foot extending to ankle reddish color, brawny staining left gaiter area Extremity Pulses:  2+ radial, brachial, absent right femoral 2+ left femoral, absent popliteal dorsalis pedis, posterior tibial pulses bilaterally Musculoskeletal: No deformity or edema  Neurologic: Upper and lower extremity motor 5/5 and symmetric  DATA:  ABI arterial duplex pending  CBC    Component Value Date/Time   WBC 6.2 06/23/2014 0929   RBC 4.69 06/23/2014 0929   HGB 14.7 06/23/2014 0929   HCT 42.3 06/23/2014 0929   PLT 207 06/23/2014 0929   MCV 90.2 06/23/2014 0929   MCH 31.3 06/23/2014 0929   MCHC 34.8 06/23/2014 0929   RDW 13.6 06/23/2014 0929   LYMPHSABS 1.4 06/23/2014 0929   MONOABS 0.4 06/23/2014 0929   EOSABS 0.1 06/23/2014 0929   BASOSABS 0.1 06/23/2014 0929    BMET    Component Value Date/Time   NA 134* 06/23/2014 0929   K 4.0 06/23/2014 0929   CL 97 06/23/2014 0929   CO2 24 06/23/2014 0929   GLUCOSE 239* 06/23/2014 0929   BUN 17 06/23/2014 0929   CREATININE 1.25 06/23/2014 0929   CALCIUM 9.1 06/23/2014 0929   GFRNONAA 60* 06/23/2014 0929   GFRAA 70* 06/23/2014 0929    ASSESSMENT:  Peripheral arterial disease most likely right iliac artery occlusion by exam most likely SFA and tibial disease as well.  Although perfusion is poor this is a chronic not acute presentation.  Will schedule for aortogram with lower extremity runoff possible intervention by my partner Dr Myra GianottiBrabham on Tuesday August 4.  Our office will contact him Monday to discuss arrival times.  Can be dc'd home today after ABI and arterial doppler completed.  Will need some  additional pain medication to last until arteriogram   PLAN:  See above  Fabienne Brunsharles Makenzie Vittorio, MD [facility] [place] Office: [phone] Pager: [phone]

## 2014-06-26 NOTE — Progress Notes (Signed)
Site area: left femoral artery 5 fr sheath Site Prior to Removal:  Level 0 Pressure Applied For: 20 minutes Manual:   yes Patient Status During Pull:  No complications  Post Pull Site:  Level 0 Post Pull Instructions Given:  Yes  Post Pull Pulses Present: doppler bilateral  Dressing Applied:  tegaderm  Bedrest begins @ 0955 Comments: sipping fluids, eating a snack, watching tv, talking with staff, happy to get coffee

## 2014-06-28 ENCOUNTER — Other Ambulatory Visit: Payer: Self-pay

## 2014-06-28 ENCOUNTER — Telehealth: Payer: Self-pay | Admitting: Vascular Surgery

## 2014-06-28 DIAGNOSIS — Z01810 Encounter for preprocedural cardiovascular examination: Secondary | ICD-10-CM

## 2014-06-28 DIAGNOSIS — I739 Peripheral vascular disease, unspecified: Secondary | ICD-10-CM

## 2014-06-28 NOTE — Telephone Encounter (Signed)
Message copied by Fredrich BirksMILLIKAN, DANA P on Thu Jun 28, 2014 10:36 AM ------      Message from: Phillips OdorPULLINS, CAROL S      Created: Wed Jun 27, 2014  5:32 PM      Regarding: needs cardiology pre-op clearance       Could you see if there is any possibility of getting cardiology appt. for this man ASAP; I couldn't tell by the chart, if he has ever been followed by cardiology.  Per Dr. [name] message below, we need to schedule his surgery by 8/17 at the latest.  (I don't have him scheduled yet for next Mon., 8/10 or Tues.,8/11, as I wasn't sure we could get him seen by cardiology that quick.  Let me know what magic you can work.(No pressure!)            Msg per [facility] on 8/5:            There was a pt named Bean that Brabham did an angio on for me on Monday.  He needs cardiology preop asap.  He needs right femoral endarterectomy, possible right common iliac stent, right fem pop            Could do this Monday or Tuesday if cleared otherwise put it on the 17th at the latest            [place]       ------

## 2014-06-28 NOTE — Telephone Encounter (Signed)
Notified pt of appt with [facility] on 06/29/14 @ 9:45- pt to arrive at 9:30am. dpm

## 2014-06-29 ENCOUNTER — Encounter (HOSPITAL_COMMUNITY): Payer: Self-pay

## 2014-06-29 ENCOUNTER — Ambulatory Visit (INDEPENDENT_AMBULATORY_CARE_PROVIDER_SITE_OTHER): Payer: Medicare HMO | Admitting: Interventional Cardiology

## 2014-06-29 ENCOUNTER — Other Ambulatory Visit (HOSPITAL_COMMUNITY): Payer: Self-pay | Admitting: *Deleted

## 2014-06-29 ENCOUNTER — Encounter: Payer: Self-pay | Admitting: Interventional Cardiology

## 2014-06-29 ENCOUNTER — Encounter (HOSPITAL_COMMUNITY)
Admission: RE | Admit: 2014-06-29 | Discharge: 2014-06-29 | Disposition: A | Discharge status: Home / Self Care | Payer: Medicare HMO | Source: Ambulatory Visit | Attending: Vascular Surgery | Admitting: Vascular Surgery

## 2014-06-29 ENCOUNTER — Encounter (HOSPITAL_COMMUNITY)
Admission: RE | Admit: 2014-06-29 | Discharge: 2014-06-29 | Disposition: A | Discharge status: Home / Self Care | Payer: Medicare HMO | Source: Ambulatory Visit | Attending: Anesthesiology | Admitting: Anesthesiology

## 2014-06-29 VITALS — BP 146/71 | HR 91 | Ht 69.0 in | Wt 158.0 lb

## 2014-06-29 DIAGNOSIS — F17200 Nicotine dependence, unspecified, uncomplicated: Secondary | ICD-10-CM

## 2014-06-29 DIAGNOSIS — E119 Type 2 diabetes mellitus without complications: Secondary | ICD-10-CM

## 2014-06-29 DIAGNOSIS — Z01818 Encounter for other preprocedural examination: Secondary | ICD-10-CM

## 2014-06-29 DIAGNOSIS — I10 Essential (primary) hypertension: Secondary | ICD-10-CM | POA: Insufficient documentation

## 2014-06-29 DIAGNOSIS — Z01810 Encounter for preprocedural cardiovascular examination: Secondary | ICD-10-CM

## 2014-06-29 DIAGNOSIS — I517 Cardiomegaly: Secondary | ICD-10-CM | POA: Insufficient documentation

## 2014-06-29 DIAGNOSIS — Z01812 Encounter for preprocedural laboratory examination: Secondary | ICD-10-CM

## 2014-06-29 DIAGNOSIS — I2510 Atherosclerotic heart disease of native coronary artery without angina pectoris: Secondary | ICD-10-CM

## 2014-06-29 DIAGNOSIS — I252 Old myocardial infarction: Secondary | ICD-10-CM

## 2014-06-29 HISTORY — DX: Anxiety disorder, unspecified: F41.9

## 2014-06-29 HISTORY — DX: Peripheral vascular disease, unspecified: I73.9

## 2014-06-29 HISTORY — DX: Acute myocardial infarction, unspecified: I21.9

## 2014-06-29 HISTORY — DX: Anemia, unspecified: D64.9

## 2014-06-29 HISTORY — DX: Other specified postprocedural states: Z98.890

## 2014-06-29 HISTORY — DX: Gastro-esophageal reflux disease without esophagitis: K21.9

## 2014-06-29 HISTORY — DX: Atherosclerotic heart disease of native coronary artery without angina pectoris: I25.10

## 2014-06-29 HISTORY — DX: Pneumonia, unspecified organism: J18.9

## 2014-06-29 HISTORY — DX: Other specified postprocedural states: R11.2

## 2014-06-29 HISTORY — DX: Myoneural disorder, unspecified: G70.9

## 2014-06-29 LAB — COMPREHENSIVE METABOLIC PANEL
ALK PHOS: 64 U/L (ref 39–117)
ALT: 34 U/L (ref 0–53)
AST: 23 U/L (ref 0–37)
Albumin: 3.7 g/dL (ref 3.5–5.2)
Anion gap: 13 (ref 5–15)
BILIRUBIN TOTAL: 0.3 mg/dL (ref 0.3–1.2)
BUN: 16 mg/dL (ref 6–23)
CHLORIDE: 100 meq/L (ref 96–112)
CO2: 26 meq/L (ref 19–32)
CREATININE: 1.37 mg/dL — AB (ref 0.50–1.35)
Calcium: 9.6 mg/dL (ref 8.4–10.5)
GFR calc Af Amer: 62 mL/min — ABNORMAL LOW (ref >90)
GFR, EST NON AFRICAN AMERICAN: 54 mL/min — AB (ref >90)
Glucose, Bld: 130 mg/dL — ABNORMAL HIGH (ref 70–99)
POTASSIUM: 4.9 meq/L (ref 3.7–5.3)
Sodium: 139 mEq/L (ref 137–147)
Total Protein: 7.4 g/dL (ref 6.0–8.3)

## 2014-06-29 LAB — CBC
HEMATOCRIT: 39.3 % (ref 39.0–52.0)
Hemoglobin: 13.6 g/dL (ref 13.0–17.0)
MCH: 31 pg (ref 26.0–34.0)
MCHC: 34.6 g/dL (ref 30.0–36.0)
MCV: 89.5 fL (ref 78.0–100.0)
Platelets: 278 10*3/uL (ref 150–400)
RBC: 4.39 MIL/uL (ref 4.22–5.81)
RDW: 13.6 % (ref 11.5–15.5)
WBC: 8.1 10*3/uL (ref 4.0–10.5)

## 2014-06-29 LAB — ABO/RH: ABO/RH(D): O POS

## 2014-06-29 LAB — SURGICAL PCR SCREEN
MRSA, PCR: NEGATIVE
Staphylococcus aureus: NEGATIVE

## 2014-06-29 LAB — PROTIME-INR
INR: 0.98 (ref 0.00–1.49)
PROTHROMBIN TIME: 13 s (ref 11.6–15.2)

## 2014-06-29 LAB — APTT: aPTT: 31 seconds (ref 24–37)

## 2014-06-29 NOTE — Pre-Procedure Instructions (Signed)
Brent Patterson  06/29/2014   Your procedure is scheduleSilvio Claymand on:  Monday, July 02, 2014 at 11:10 AM.   [facility] "A"  Admitting Office at 9:10 AM.   Call this number if you have problems the morning of surgery: [phone]   Remember:   Do not eat food or drink liquids after midnight Sunday, 07/01/14.   Take these medicines the morning of surgery with A SIP OF WATER: amLODipine (NORVASC), gabapentin (NEURONTIN), HYDROcodone-acetaminophen (NORCO/VICODIN) or oxyCODONE (OXY IR/ROXICODONE) - if needed, nitroGLYCERIN (NITROSTAT) - if needed, ranitidine (ZANTAC) - if needed.    Do not wear jewelry.  Do not wear lotions, powders, or cologne. You may wear deodorant.  Men may shave face and neck.  Do not bring valuables to the hospital.  [facility] is not responsible                  for any belongings or valuables.               Contacts, dentures or bridgework may not be worn into surgery.  Leave suitcase in the car. After surgery it may be brought to your room.  For patients admitted to the hospital, discharge time is determined by your                treatment team.               Special Instructions: [facility] - Preparing for Surgery  Before surgery, you can play an important role.  Because skin is not sterile, your skin needs to be as free of germs as possible.  You can reduce the number of germs on you skin by washing with CHG (chlorahexidine gluconate) soap before surgery.  CHG is an antiseptic cleaner which kills germs and bonds with the skin to continue killing germs even after washing.  Please DO NOT use if you have an allergy to CHG or antibacterial soaps.  If your skin becomes reddened/irritated stop using the CHG and inform your nurse when you arrive at Short Stay.  Do not shave (including legs and underarms) for at least 48 hours prior to the first CHG shower.  You may shave your face.  Please follow these instructions carefully:   1.  Shower with  CHG Soap the night before surgery and the                                morning of Surgery.  2.  If you choose to wash your hair, wash your hair first as usual with your       normal shampoo.  3.  After you shampoo, rinse your hair and body thoroughly to remove the                      Shampoo.  4.  Use CHG as you would any other liquid soap.  You can apply chg directly       to the skin and wash gently with scrungie or a clean washcloth.  5.  Apply the CHG Soap to your body ONLY FROM THE NECK DOWN.        Do not use on open wounds or open sores.  Avoid contact with your eyes, ears, mouth and genitals (private parts).  Wash genitals (private parts) with your normal soap.  6.  Wash thoroughly, paying special attention to the area where  your surgery        will be performed.  7.  Thoroughly rinse your body with warm water from the neck down.  8.  DO NOT shower/wash with your normal soap after using and rinsing off       the CHG Soap.  9.  Pat yourself dry with a clean towel.            10.  Wear clean pajamas.            11.  Place clean sheets on your bed the night of your first shower and do not        sleep with pets.  Day of Surgery  Do not apply any lotions the morning of surgery.  Please wear clean clothes to the hospital/surgery center.     Please read over the following fact sheets that you were given: Pain Booklet, Coughing and Deep Breathing, Blood Transfusion Information, MRSA Information and Surgical Site Infection Prevention

## 2014-06-29 NOTE — Progress Notes (Signed)
Patient ID: Brent Patterson, male   DOB: Nov 18, 1951, 63 y.o.   MRN: [id number]    [address] [place], [place]  [address] Phone: [phone] Fax:  [phone]  Date:  06/29/2014   ID:  Brent Patterson, DOB Nov 18, 1951, MRN [id number]  PCP:  Noni SaupeEDDING II,JOHN F., MD      History of Present Illness: Brent Patterson is a 63 y.o. male who has had CAD.  He had CABG in 2008.  He has a nonhealing ulcer on his right ankle that requires an intervention to his right leg including surgery.  He had an MI in the past.  He had a 2 vessel CABG and was told that part of his heart was damaged.  He has not had a recent stress test.  Walking is limited by his leg pain.  He has not walked up stairs in a year.  He walks around the house with a cane.  No chest pain with this exertion.  He has not used any SL NTG of late.  His prescription has expired.  No SHOB.  No orthopnea. No PND.  No palpitations.  Occasional anxiety attack- not related to exertion, but more related to the severe right leg pain.    He continues to smoke.  He has cut back.     Wt Readings from Last 3 Encounters:  06/29/14 158 lb (71.668 kg)  06/26/14 164 lb (74.39 kg)  06/26/14 164 lb (74.39 kg)     Past Medical History  Diagnosis Date  . Diabetes mellitus without complication   . Hypertension   . Hyperlipidemia   . CHF (congestive heart failure)   . Dermatitis, stasis     Current Outpatient Prescriptions  Medication Sig Dispense Refill  . amLODipine (NORVASC) 10 MG tablet Take 10 mg by mouth every morning.       . escitalopram (LEXAPRO) 10 MG tablet Take 10 mg by mouth every evening.       . gabapentin (NEURONTIN) 100 MG capsule Take 200 mg by mouth 2 (two) times daily.      Marland Kitchen. glimepiride (AMARYL) 2 MG tablet Take 1 mg by mouth every evening.      Marland Kitchen. HYDROcodone-acetaminophen (NORCO/VICODIN) 5-325 MG per tablet Take 1 tablet by mouth every 4 (four) hours as needed for moderate pain.      Marland Kitchen. lisinopril (PRINIVIL,ZESTRIL) 20  MG tablet Take 20 mg by mouth every evening.       . nitroGLYCERIN (NITROSTAT) 0.4 MG SL tablet Place 0.4 mg under the tongue every 5 (five) minutes as needed for chest pain.      Marland Kitchen. oxyCODONE (OXY IR/ROXICODONE) 5 MG immediate release tablet Take 5 mg by mouth every 4 (four) hours as needed for severe pain.       . pravastatin (PRAVACHOL) 40 MG tablet Take 40 mg by mouth every evening.       . ranitidine (ZANTAC) 150 MG tablet Take 150 mg by mouth daily as needed for heartburn.       . sitaGLIPtin (JANUVIA) 100 MG tablet Take 50 mg by mouth every evening.      . traZODone (DESYREL) 100 MG tablet Take 100 mg by mouth at bedtime as needed for sleep.        No current facility-administered medications for this visit.    Allergies:    Allergies  Allergen Reactions  . Codeine Other (See Comments)    Chills "Made me sick to my stomach"  Social History:  The patient  reports that he has been smoking Cigarettes.  He has been smoking about 0.00 packs per day. He does not have any smokeless tobacco history on file. He reports that he does not drink alcohol or use illicit drugs.   Family History:  The patient's family history includes Cancer - Other in his father; Diabetes in his mother; Hypertension in his mother.   ROS:  Please see the history of present illness.  No nausea, vomiting.  No fevers, chills.  No focal weakness.  No dysuria. Severe right leg pain.   All other systems reviewed and negative.   PHYSICAL EXAM: VS:  BP 146/71  Pulse 91  Ht 5\' 9"  (1.753 m)  Wt 158 lb (71.668 kg)  BMI 23.32 kg/m2 Well nourished, well developed, in no acute distress HEENT: normal Neck: no JVD, no carotid bruits Cardiac:  normal S1, S2; RRR;  Lungs:  clear to auscultation bilaterally, no wheezing, rhonchi or rales Abd: soft, nontender, no hepatomegaly Ext:   right leg erythematous with small ulcer at the medial malleolus Skin: warm and dry, erythema as above  Neuro:   no focal abnormalities  noted  EKG:  Normal sinus rhythm, left bundle branch block pattern, PVCs, question LVH with repolarization changes   ASSESSMENT AND PLAN:  1.  Coronary artery disease: History of bypass. All of his care has been [place] or [place] in the past.  He is on medical therapy for CAD including aspirin, statin, ACE inhibitor. He will need regular cardiac care. No angina at this time. No recent nitroglycerin use. He does walk around his house although this is somewhat limited, and has no symptoms of ischemia. No symptoms of heart failure despite what he describes is likely a prior myocardial infarction. 2. Preoperative evaluation: I discussed diagnostic testing with Dr. Darrick Penna over the phone today. Would normally consider a pharmacologic stress test. However, his surgery is more urgent. Ischemia evaluation at this time would potentially create a significant delay and put his leg at risk. Given his history, he is intermediate risk of cardiac complications. It will be difficult to lower this risk even with further ischemia evaluation. Given his current lack of symptoms, we'll allow him to proceed with surgery. The patient is in agreement given his severe right leg pain. Dr. Darrick Penna agrees as well.   3. Tobacco abuse: He really is to stop smoking. He has cut back.  Signed, Fredric Mare, MD, [facility] 06/29/2014 10:26 AM

## 2014-06-29 NOTE — Patient Instructions (Addendum)
Your physician recommends that you continue on your current medications as directed. Please refer to the Current Medication list given to you today.  Your physician wants you to follow-up in: 6 months with Dr. Varanasi.  You will receive a reminder letter in the mail two months in advance. If you don't receive a letter, please call our office to schedule the follow-up appointment.  

## 2014-06-29 NOTE — Progress Notes (Signed)
Pt saw Dr. Eldridge DaceVaranasi this AM. Cardiac clearance note in Dr. Hoyle BarrVaranasi's office note.   Pt states he had an EKG done at Dr. Hoyle BarrVaranasi's office, not in [facility] yet, did find reading in Dr. Hoyle BarrVaranasi's office note.

## 2014-07-01 MED ORDER — CHLORHEXIDINE GLUCONATE 4 % EX LIQD
60.0000 mL | Freq: Once | CUTANEOUS | Status: DC
  Filled 2014-07-01: qty 60

## 2014-07-01 MED ORDER — DEXTROSE 5 % IV SOLN
1.5000 g | INTRAVENOUS | Status: AC
  Administered 2014-07-02 (×2): 1.5 g via INTRAVENOUS
  Filled 2014-07-01: qty 1.5

## 2014-07-01 MED ORDER — SODIUM CHLORIDE 0.9 % IV SOLN: INTRAVENOUS | Status: DC

## 2014-07-02 ENCOUNTER — Encounter (HOSPITAL_COMMUNITY)
Admission: RE | Disposition: A | Discharge status: Skilled Nursing Facility | Payer: Self-pay | Source: Ambulatory Visit | Attending: Vascular Surgery

## 2014-07-02 ENCOUNTER — Inpatient Hospital Stay (HOSPITAL_COMMUNITY): Payer: Medicare HMO | Admitting: Anesthesiology

## 2014-07-02 ENCOUNTER — Inpatient Hospital Stay (HOSPITAL_COMMUNITY)
Admission: RE | Admit: 2014-07-02 | Discharge: 2014-07-07 | DRG: 253 | Disposition: A | Discharge status: Skilled Nursing Facility | Payer: Medicare HMO | Source: Ambulatory Visit | Attending: Vascular Surgery | Admitting: Vascular Surgery

## 2014-07-02 ENCOUNTER — Encounter (HOSPITAL_COMMUNITY): Payer: Medicare HMO | Admitting: Anesthesiology

## 2014-07-02 ENCOUNTER — Encounter (HOSPITAL_COMMUNITY): Payer: Self-pay | Admitting: Anesthesiology

## 2014-07-02 DIAGNOSIS — D62 Acute posthemorrhagic anemia: Secondary | ICD-10-CM | POA: Diagnosis not present

## 2014-07-02 DIAGNOSIS — E785 Hyperlipidemia, unspecified: Secondary | ICD-10-CM | POA: Diagnosis present

## 2014-07-02 DIAGNOSIS — I7092 Chronic total occlusion of artery of the extremities: Secondary | ICD-10-CM | POA: Diagnosis present

## 2014-07-02 DIAGNOSIS — Z791 Long term (current) use of non-steroidal anti-inflammatories (NSAID): Secondary | ICD-10-CM | POA: Diagnosis not present

## 2014-07-02 DIAGNOSIS — K219 Gastro-esophageal reflux disease without esophagitis: Secondary | ICD-10-CM | POA: Diagnosis present

## 2014-07-02 DIAGNOSIS — M79609 Pain in unspecified limb: Secondary | ICD-10-CM | POA: Diagnosis present

## 2014-07-02 DIAGNOSIS — E1142 Type 2 diabetes mellitus with diabetic polyneuropathy: Secondary | ICD-10-CM | POA: Diagnosis present

## 2014-07-02 DIAGNOSIS — I252 Old myocardial infarction: Secondary | ICD-10-CM | POA: Diagnosis not present

## 2014-07-02 DIAGNOSIS — Z951 Presence of aortocoronary bypass graft: Secondary | ICD-10-CM

## 2014-07-02 DIAGNOSIS — Z885 Allergy status to narcotic agent status: Secondary | ICD-10-CM

## 2014-07-02 DIAGNOSIS — I10 Essential (primary) hypertension: Secondary | ICD-10-CM | POA: Diagnosis present

## 2014-07-02 DIAGNOSIS — F17200 Nicotine dependence, unspecified, uncomplicated: Secondary | ICD-10-CM | POA: Diagnosis present

## 2014-07-02 DIAGNOSIS — E1149 Type 2 diabetes mellitus with other diabetic neurological complication: Secondary | ICD-10-CM | POA: Diagnosis present

## 2014-07-02 DIAGNOSIS — I2510 Atherosclerotic heart disease of native coronary artery without angina pectoris: Secondary | ICD-10-CM | POA: Diagnosis present

## 2014-07-02 DIAGNOSIS — I509 Heart failure, unspecified: Secondary | ICD-10-CM | POA: Diagnosis present

## 2014-07-02 DIAGNOSIS — I70229 Atherosclerosis of native arteries of extremities with rest pain, unspecified extremity: Principal | ICD-10-CM

## 2014-07-02 DIAGNOSIS — I739 Peripheral vascular disease, unspecified: Secondary | ICD-10-CM | POA: Diagnosis present

## 2014-07-02 DIAGNOSIS — I745 Embolism and thrombosis of iliac artery: Secondary | ICD-10-CM | POA: Diagnosis present

## 2014-07-02 HISTORY — PX: INSERTION OF ILIAC STENT: SHX6256

## 2014-07-02 HISTORY — PX: FEMORAL-POPLITEAL BYPASS GRAFT: SHX937

## 2014-07-02 LAB — POCT I-STAT 7, (LYTES, BLD GAS, ICA,H+H)
Acid-Base Excess: 2 mmol/L (ref 0.0–2.0)
Bicarbonate: 25.1 mEq/L — ABNORMAL HIGH (ref 20.0–24.0)
CALCIUM ION: 1.15 mmol/L (ref 1.13–1.30)
HCT: 31 % — ABNORMAL LOW (ref 39.0–52.0)
HEMOGLOBIN: 10.5 g/dL — AB (ref 13.0–17.0)
O2 SAT: 100 %
PH ART: 7.49 — AB (ref 7.350–7.450)
POTASSIUM: 4.2 meq/L (ref 3.7–5.3)
Patient temperature: 37.9
Sodium: 135 mEq/L — ABNORMAL LOW (ref 137–147)
TCO2: 26 mmol/L (ref 0–100)
pCO2 arterial: 33.2 mmHg — ABNORMAL LOW (ref 35.0–45.0)
pO2, Arterial: 258 mmHg — ABNORMAL HIGH (ref 80.0–100.0)

## 2014-07-02 LAB — GLUCOSE, CAPILLARY
Glucose-Capillary: 122 mg/dL — ABNORMAL HIGH (ref 70–99)
Glucose-Capillary: 131 mg/dL — ABNORMAL HIGH (ref 70–99)
Glucose-Capillary: 181 mg/dL — ABNORMAL HIGH (ref 70–99)

## 2014-07-02 SURGERY — BYPASS GRAFT FEMORAL-POPLITEAL ARTERY
Anesthesia: General | Site: Leg Upper | Laterality: Right

## 2014-07-02 MED ORDER — ENOXAPARIN SODIUM 30 MG/0.3ML ~~LOC~~ SOLN
30.0000 mg | SUBCUTANEOUS | Status: DC
  Administered 2014-07-03 – 2014-07-07 (×5): 30 mg via SUBCUTANEOUS
  Filled 2014-07-02 (×7): qty 0.3

## 2014-07-02 MED ORDER — HEPARIN SODIUM (PORCINE) 1000 UNIT/ML IJ SOLN
INTRAMUSCULAR | Status: DC | PRN
  Administered 2014-07-02 (×4): 8000 [IU] via INTRAVENOUS

## 2014-07-02 MED ORDER — SUFENTANIL CITRATE 50 MCG/ML IV SOLN
INTRAVENOUS | Status: DC | PRN
  Administered 2014-07-02 (×8): 10 ug via INTRAVENOUS

## 2014-07-02 MED ORDER — POTASSIUM CHLORIDE CRYS ER 20 MEQ PO TBCR: 20.0000 meq | EXTENDED_RELEASE_TABLET | Freq: Every day | ORAL | Status: DC | PRN

## 2014-07-02 MED ORDER — LACTATED RINGERS IV SOLN
INTRAVENOUS | Status: DC | PRN
  Administered 2014-07-02 (×2): via INTRAVENOUS

## 2014-07-02 MED ORDER — POLYETHYLENE GLYCOL 3350 17 G PO PACK
17.0000 g | PACK | Freq: Every day | ORAL | Status: DC | PRN
  Administered 2014-07-05: 17 g via ORAL
  Filled 2014-07-02: qty 1

## 2014-07-02 MED ORDER — SODIUM CHLORIDE 0.9 % IV SOLN: 500.0000 mL | Freq: Once | INTRAVENOUS | Status: AC | PRN

## 2014-07-02 MED ORDER — HYDROMORPHONE HCL PF 1 MG/ML IJ SOLN
INTRAMUSCULAR | Status: AC
  Filled 2014-07-02: qty 1

## 2014-07-02 MED ORDER — TRAZODONE HCL 100 MG PO TABS
100.0000 mg | ORAL_TABLET | Freq: Every evening | ORAL | Status: DC | PRN
  Filled 2014-07-02: qty 1

## 2014-07-02 MED ORDER — PANTOPRAZOLE SODIUM 40 MG PO TBEC
40.0000 mg | DELAYED_RELEASE_TABLET | Freq: Every day | ORAL | Status: DC
  Administered 2014-07-03 – 2014-07-07 (×5): 40 mg via ORAL
  Filled 2014-07-02 (×3): qty 1

## 2014-07-02 MED ORDER — PROTAMINE SULFATE 10 MG/ML IV SOLN
INTRAVENOUS | Status: DC | PRN
  Administered 2014-07-02: 50 mg via INTRAVENOUS

## 2014-07-02 MED ORDER — DOPAMINE-DEXTROSE 3.2-5 MG/ML-% IV SOLN: 3.0000 ug/kg/min | INTRAVENOUS | Status: DC

## 2014-07-02 MED ORDER — LACTATED RINGERS IV SOLN
INTRAVENOUS | Status: DC
  Administered 2014-07-02: 09:00:00 via INTRAVENOUS

## 2014-07-02 MED ORDER — SUFENTANIL CITRATE 50 MCG/ML IV SOLN
INTRAVENOUS | Status: AC
  Filled 2014-07-02: qty 1

## 2014-07-02 MED ORDER — MAGNESIUM SULFATE 40 MG/ML IJ SOLN
2.0000 g | Freq: Every day | INTRAMUSCULAR | Status: DC | PRN
  Filled 2014-07-02: qty 50

## 2014-07-02 MED ORDER — SUCCINYLCHOLINE CHLORIDE 20 MG/ML IJ SOLN
INTRAMUSCULAR | Status: DC | PRN
  Administered 2014-07-02: 100 mg via INTRAVENOUS

## 2014-07-02 MED ORDER — DEXTROSE 5 % IV SOLN
1.5000 g | Freq: Two times a day (BID) | INTRAVENOUS | Status: AC
  Administered 2014-07-03 (×2): 1.5 g via INTRAVENOUS
  Filled 2014-07-02 (×2): qty 1.5

## 2014-07-02 MED ORDER — 0.9 % SODIUM CHLORIDE (POUR BTL) OPTIME
TOPICAL | Status: DC | PRN
  Administered 2014-07-02: 2000 mL

## 2014-07-02 MED ORDER — BISACODYL 10 MG RE SUPP: 10.0000 mg | Freq: Every day | RECTAL | Status: DC | PRN

## 2014-07-02 MED ORDER — OXYCODONE-ACETAMINOPHEN 5-325 MG PO TABS
1.0000 | ORAL_TABLET | ORAL | Status: DC | PRN
  Administered 2014-07-03 – 2014-07-04 (×6): 2 via ORAL
  Administered 2014-07-04: 1 via ORAL
  Administered 2014-07-04 (×2): 2 via ORAL
  Administered 2014-07-04: 1 via ORAL
  Administered 2014-07-05 – 2014-07-07 (×8): 2 via ORAL
  Filled 2014-07-02 (×17): qty 2

## 2014-07-02 MED ORDER — SODIUM CHLORIDE 0.9 % IV SOLN
INTRAVENOUS | Status: DC
  Administered 2014-07-02 – 2014-07-06 (×4): via INTRAVENOUS

## 2014-07-02 MED ORDER — LACTATED RINGERS IV SOLN
INTRAVENOUS | Status: DC | PRN
  Administered 2014-07-02 (×2): via INTRAVENOUS

## 2014-07-02 MED ORDER — MIDAZOLAM HCL 5 MG/5ML IJ SOLN
INTRAMUSCULAR | Status: DC | PRN
  Administered 2014-07-02: 2 mg via INTRAVENOUS

## 2014-07-02 MED ORDER — ALUM & MAG HYDROXIDE-SIMETH 200-200-20 MG/5ML PO SUSP: 15.0000 mL | ORAL | Status: DC | PRN

## 2014-07-02 MED ORDER — ACETAMINOPHEN 325 MG PO TABS
325.0000 mg | ORAL_TABLET | ORAL | Status: DC | PRN
Start: 2014-07-02 — End: 2014-07-07
  Filled 2014-07-02: qty 2

## 2014-07-02 MED ORDER — ENOXAPARIN SODIUM 30 MG/0.3ML ~~LOC~~ SOLN: 30.0000 mg | SUBCUTANEOUS | Status: DC

## 2014-07-02 MED ORDER — AMLODIPINE BESYLATE 10 MG PO TABS
10.0000 mg | ORAL_TABLET | Freq: Every morning | ORAL | Status: DC
Start: 2014-07-03 — End: 2014-07-07
  Administered 2014-07-03 – 2014-07-07 (×5): 10 mg via ORAL
  Filled 2014-07-02 (×5): qty 1

## 2014-07-02 MED ORDER — METOPROLOL TARTRATE 1 MG/ML IV SOLN: 2.0000 mg | INTRAVENOUS | Status: DC | PRN

## 2014-07-02 MED ORDER — FENTANYL CITRATE 0.05 MG/ML IJ SOLN
INTRAMUSCULAR | Status: AC
  Administered 2014-07-02: 100 ug
  Filled 2014-07-02: qty 2

## 2014-07-02 MED ORDER — PHENOL 1.4 % MT LIQD: 1.0000 | OROMUCOSAL | Status: DC | PRN

## 2014-07-02 MED ORDER — NEOSTIGMINE METHYLSULFATE 10 MG/10ML IV SOLN
INTRAVENOUS | Status: DC | PRN
  Administered 2014-07-02: 2 mg via INTRAVENOUS

## 2014-07-02 MED ORDER — CEFUROXIME SODIUM 750 MG IJ SOLR
INTRAMUSCULAR | Status: AC
  Filled 2014-07-02: qty 750

## 2014-07-02 MED ORDER — ONDANSETRON HCL 4 MG/2ML IJ SOLN
INTRAMUSCULAR | Status: DC | PRN
  Administered 2014-07-02: 4 mg via INTRAVENOUS

## 2014-07-02 MED ORDER — GABAPENTIN 100 MG PO CAPS
200.0000 mg | ORAL_CAPSULE | Freq: Two times a day (BID) | ORAL | Status: DC
  Administered 2014-07-02 – 2014-07-07 (×10): 200 mg via ORAL
  Filled 2014-07-02 (×11): qty 2

## 2014-07-02 MED ORDER — NITROGLYCERIN 0.4 MG SL SUBL: 0.4000 mg | SUBLINGUAL_TABLET | SUBLINGUAL | Status: DC | PRN

## 2014-07-02 MED ORDER — HYDRALAZINE HCL 20 MG/ML IJ SOLN: 10.0000 mg | INTRAMUSCULAR | Status: DC | PRN

## 2014-07-02 MED ORDER — THROMBIN 20000 UNITS EX SOLR
CUTANEOUS | Status: AC
  Filled 2014-07-02: qty 20000

## 2014-07-02 MED ORDER — MAGNESIUM SULFATE 40 MG/ML IJ SOLN: 2.0000 g | Freq: Every day | INTRAMUSCULAR | Status: DC | PRN

## 2014-07-02 MED ORDER — SODIUM CHLORIDE 0.9 % IR SOLN
Status: DC | PRN
  Administered 2014-07-02 (×3)

## 2014-07-02 MED ORDER — PROPOFOL 10 MG/ML IV BOLUS
INTRAVENOUS | Status: DC | PRN
  Administered 2014-07-02: 160 mg via INTRAVENOUS

## 2014-07-02 MED ORDER — ROCURONIUM BROMIDE 100 MG/10ML IV SOLN
INTRAVENOUS | Status: DC | PRN
  Administered 2014-07-02: 50 mg via INTRAVENOUS

## 2014-07-02 MED ORDER — GUAIFENESIN-DM 100-10 MG/5ML PO SYRP: 15.0000 mL | ORAL_SOLUTION | ORAL | Status: DC | PRN

## 2014-07-02 MED ORDER — ACETAMINOPHEN 325 MG RE SUPP
325.0000 mg | RECTAL | Status: DC | PRN
  Filled 2014-07-02: qty 2

## 2014-07-02 MED ORDER — PROPOFOL 10 MG/ML IV BOLUS
INTRAVENOUS | Status: AC
  Filled 2014-07-02: qty 20

## 2014-07-02 MED ORDER — LINAGLIPTIN 5 MG PO TABS
5.0000 mg | ORAL_TABLET | Freq: Every day | ORAL | Status: DC
  Administered 2014-07-03 – 2014-07-07 (×5): 5 mg via ORAL
  Filled 2014-07-02 (×5): qty 1

## 2014-07-02 MED ORDER — DOCUSATE SODIUM 100 MG PO CAPS
100.0000 mg | ORAL_CAPSULE | Freq: Every day | ORAL | Status: DC
  Administered 2014-07-03 – 2014-07-07 (×5): 100 mg via ORAL
  Filled 2014-07-02 (×5): qty 1

## 2014-07-02 MED ORDER — CLOPIDOGREL BISULFATE 75 MG PO TABS
75.0000 mg | ORAL_TABLET | Freq: Every day | ORAL | Status: DC
  Administered 2014-07-03 – 2014-07-07 (×5): 75 mg via ORAL
  Filled 2014-07-02 (×5): qty 1

## 2014-07-02 MED ORDER — GLYCOPYRROLATE 0.2 MG/ML IJ SOLN
INTRAMUSCULAR | Status: DC | PRN
  Administered 2014-07-02: 0.4 mg via INTRAVENOUS

## 2014-07-02 MED ORDER — HYDROMORPHONE HCL PF 1 MG/ML IJ SOLN
0.2500 mg | INTRAMUSCULAR | Status: DC | PRN
  Administered 2014-07-02: 0.5 mg via INTRAVENOUS

## 2014-07-02 MED ORDER — EPHEDRINE SULFATE 50 MG/ML IJ SOLN
INTRAMUSCULAR | Status: DC | PRN
  Administered 2014-07-02: 5 mg via INTRAVENOUS

## 2014-07-02 MED ORDER — LISINOPRIL 20 MG PO TABS
20.0000 mg | ORAL_TABLET | Freq: Every evening | ORAL | Status: DC
  Administered 2014-07-02 – 2014-07-06 (×5): 20 mg via ORAL
  Filled 2014-07-02 (×6): qty 1

## 2014-07-02 MED ORDER — ESCITALOPRAM OXALATE 10 MG PO TABS
10.0000 mg | ORAL_TABLET | Freq: Every evening | ORAL | Status: DC
  Administered 2014-07-02 – 2014-07-06 (×5): 10 mg via ORAL
  Filled 2014-07-02 (×6): qty 1

## 2014-07-02 MED ORDER — ONDANSETRON HCL 4 MG/2ML IJ SOLN
4.0000 mg | Freq: Four times a day (QID) | INTRAMUSCULAR | Status: DC | PRN
  Administered 2014-07-02 – 2014-07-07 (×5): 4 mg via INTRAVENOUS
  Filled 2014-07-02 (×5): qty 2

## 2014-07-02 MED ORDER — PROMETHAZINE HCL 25 MG/ML IJ SOLN: 6.2500 mg | INTRAMUSCULAR | Status: DC | PRN

## 2014-07-02 MED ORDER — SODIUM CHLORIDE 0.9 % IV SOLN: 500.0000 mL | Freq: Once | INTRAVENOUS | Status: DC | PRN

## 2014-07-02 MED ORDER — MIDAZOLAM HCL 2 MG/2ML IJ SOLN
INTRAMUSCULAR | Status: AC
  Filled 2014-07-02: qty 2

## 2014-07-02 MED ORDER — GLIMEPIRIDE 1 MG PO TABS
1.0000 mg | ORAL_TABLET | Freq: Every evening | ORAL | Status: DC
  Administered 2014-07-03 – 2014-07-06 (×3): 1 mg via ORAL
  Filled 2014-07-02 (×5): qty 1

## 2014-07-02 MED ORDER — HYDROMORPHONE HCL PF 1 MG/ML IJ SOLN
0.5000 mg | INTRAMUSCULAR | Status: DC | PRN
  Administered 2014-07-02: 0.5 mg via INTRAVENOUS
  Administered 2014-07-03 (×2): 1 mg via INTRAVENOUS
  Filled 2014-07-02 (×3): qty 1

## 2014-07-02 MED ORDER — INSULIN ASPART 100 UNIT/ML ~~LOC~~ SOLN
0.0000 [IU] | Freq: Three times a day (TID) | SUBCUTANEOUS | Status: DC
  Administered 2014-07-03: 3 [IU] via SUBCUTANEOUS
  Administered 2014-07-03: 2 [IU] via SUBCUTANEOUS

## 2014-07-02 MED ORDER — SIMVASTATIN 20 MG PO TABS
20.0000 mg | ORAL_TABLET | Freq: Every day | ORAL | Status: DC
  Administered 2014-07-03 – 2014-07-06 (×4): 20 mg via ORAL
  Filled 2014-07-02 (×5): qty 1

## 2014-07-02 MED ORDER — LABETALOL HCL 5 MG/ML IV SOLN
10.0000 mg | INTRAVENOUS | Status: DC | PRN
Start: 2014-07-02 — End: 2014-07-07
  Filled 2014-07-02: qty 4

## 2014-07-02 MED ORDER — IOHEXOL 300 MG/ML  SOLN
INTRAMUSCULAR | Status: DC | PRN
  Administered 2014-07-02: 25 mL via INTRAVENOUS
  Administered 2014-07-02 (×2): 50 mL via INTRAVENOUS

## 2014-07-02 MED ORDER — LIDOCAINE HCL (CARDIAC) 20 MG/ML IV SOLN
INTRAVENOUS | Status: DC | PRN
  Administered 2014-07-02: 60 mg via INTRAVENOUS

## 2014-07-02 MED ORDER — DEXTROSE 5 % IV SOLN
10.0000 mg | INTRAVENOUS | Status: DC | PRN
  Administered 2014-07-02: 10 ug/min via INTRAVENOUS

## 2014-07-02 SURGICAL SUPPLY — 98 items
BALLN MUSTANG 8X80X75 (BALLOONS) ×3
BALLOON MUSTANG 8X80X75 (BALLOONS) ×2 IMPLANT
BANDAGE ESMARK 6X9 LF (GAUZE/BANDAGES/DRESSINGS) IMPLANT
BLADE 10 SAFETY STRL DISP (BLADE) ×3 IMPLANT
BNDG ESMARK 6X9 LF (GAUZE/BANDAGES/DRESSINGS)
CANISTER SUCTION 2500CC (MISCELLANEOUS) ×3 IMPLANT
CANNULA VESSEL W/WING WO/VALVE (CANNULA) IMPLANT
CATH BEACON 5.038 65CM KMP-01 (CATHETERS) ×3 IMPLANT
CATH CROSS OVER TEMPO 5F (CATHETERS) ×6 IMPLANT
CATH OMNI FLUSH .035X70CM (CATHETERS) ×6 IMPLANT
CATH SOFT-VU 4F 65 STRAIGHT (CATHETERS) ×2 IMPLANT
CATH SOFT-VU STRAIGHT 4F 65CM (CATHETERS) ×1
CLIP TI MEDIUM 24 (CLIP) ×3 IMPLANT
CLIP TI WIDE RED SMALL 24 (CLIP) ×3 IMPLANT
COVER PROBE W GEL 5X96 (DRAPES) ×3 IMPLANT
COVER SURGICAL LIGHT HANDLE (MISCELLANEOUS) ×3 IMPLANT
COVER TABLE BACK 60X90 (DRAPES) ×3 IMPLANT
CUFF TOURNIQUET SINGLE 24IN (TOURNIQUET CUFF) IMPLANT
CUFF TOURNIQUET SINGLE 34IN LL (TOURNIQUET CUFF) IMPLANT
CUFF TOURNIQUET SINGLE 44IN (TOURNIQUET CUFF) IMPLANT
DERMABOND ADHESIVE PROPEN (GAUZE/BANDAGES/DRESSINGS) ×1
DERMABOND ADVANCED (GAUZE/BANDAGES/DRESSINGS)
DERMABOND ADVANCED .7 DNX12 (GAUZE/BANDAGES/DRESSINGS) IMPLANT
DERMABOND ADVANCED .7 DNX6 (GAUZE/BANDAGES/DRESSINGS) ×2 IMPLANT
DEVICE CLOSURE PERCLS PRGLD 6F (VASCULAR PRODUCTS) ×4 IMPLANT
DEVICE TORQUE KENDALL .025-038 (MISCELLANEOUS) ×3 IMPLANT
DRAIN SNY WOU (WOUND CARE) IMPLANT
DRAPE WARM FLUID 44X44 (DRAPE) ×3 IMPLANT
DRAPE X-RAY CASS 24X20 (DRAPES) IMPLANT
DRESSING OPSITE X SMALL 2X3 (GAUZE/BANDAGES/DRESSINGS) ×3 IMPLANT
DRSG COVADERM 4X14 (GAUZE/BANDAGES/DRESSINGS) ×6 IMPLANT
DRSG COVADERM 4X6 (GAUZE/BANDAGES/DRESSINGS) ×3 IMPLANT
DRSG TEGADERM 4X4.75 (GAUZE/BANDAGES/DRESSINGS) ×6 IMPLANT
ELECT REM PT RETURN 9FT ADLT (ELECTROSURGICAL) ×3
ELECTRODE REM PT RTRN 9FT ADLT (ELECTROSURGICAL) ×2 IMPLANT
EVACUATOR SILICONE 100CC (DRAIN) IMPLANT
GAUZE SPONGE 2X2 8PLY NS (GAUZE/BANDAGES/DRESSINGS) ×3 IMPLANT
GAUZE SPONGE 4X4 16PLY XRAY LF (GAUZE/BANDAGES/DRESSINGS) ×6 IMPLANT
GLOVE BIO SURGEON STRL SZ7.5 (GLOVE) ×9 IMPLANT
GLOVE BIOGEL PI IND STRL 7.5 (GLOVE) ×8 IMPLANT
GLOVE BIOGEL PI INDICATOR 7.5 (GLOVE) ×4
GLOVE ECLIPSE 7.5 STRL STRAW (GLOVE) ×9 IMPLANT
GLOVE SS BIOGEL STRL SZ 7 (GLOVE) ×6 IMPLANT
GLOVE SS BIOGEL STRL SZ 7.5 (GLOVE) ×2 IMPLANT
GLOVE SUPERSENSE BIOGEL SZ 7 (GLOVE) ×3
GLOVE SUPERSENSE BIOGEL SZ 7.5 (GLOVE) ×1
GLOVE SURG SS PI 7.5 STRL IVOR (GLOVE) ×3 IMPLANT
GOWN STRL REUS W/ TWL LRG LVL3 (GOWN DISPOSABLE) ×14 IMPLANT
GOWN STRL REUS W/ TWL XL LVL3 (GOWN DISPOSABLE) ×8 IMPLANT
GOWN STRL REUS W/TWL LRG LVL3 (GOWN DISPOSABLE) ×7
GOWN STRL REUS W/TWL XL LVL3 (GOWN DISPOSABLE) ×4
GUIDEWIRE ANGLED .035X150CM (WIRE) ×3 IMPLANT
GUIDEWIRE WHOLEY .035 145 JTIP (WIRE) ×3 IMPLANT
KIT BASIN OR (CUSTOM PROCEDURE TRAY) ×3 IMPLANT
KIT ENCORE 26 ADVANTAGE (KITS) ×3 IMPLANT
KIT ROOM TURNOVER OR (KITS) ×3 IMPLANT
LOOP VESSEL MINI RED (MISCELLANEOUS) ×6 IMPLANT
NS IRRIG 1000ML POUR BTL (IV SOLUTION) ×6 IMPLANT
PACK PERIPHERAL VASCULAR (CUSTOM PROCEDURE TRAY) ×3 IMPLANT
PAD ARMBOARD 7.5X6 YLW CONV (MISCELLANEOUS) ×6 IMPLANT
PADDING CAST COTTON 6X4 STRL (CAST SUPPLIES) IMPLANT
PERCLOSE PROGLIDE 6F (VASCULAR PRODUCTS) ×6
PROTECTION STATION PRESSURIZED (MISCELLANEOUS) ×3
SET COLLECT BLD 21X3/4 12 (NEEDLE) ×3 IMPLANT
SHEATH AVANTI 11CM 8FR (MISCELLANEOUS) ×3 IMPLANT
SHEATH BRITE TIP 7FR 35CM (SHEATH) ×3 IMPLANT
SHEATH BRITE TIP 7FRX11 (SHEATH) ×3 IMPLANT
SPONGE SURGIFOAM ABS GEL 100 (HEMOSTASIS) IMPLANT
STAPLER VISISTAT 35W (STAPLE) ×6 IMPLANT
STATION PROTECTION PRESSURIZED (MISCELLANEOUS) ×2 IMPLANT
STENT ICAST 8X38X120 (Permanent Stent) ×3 IMPLANT
STENT ICAST 8X59X120 (Permanent Stent) ×6 IMPLANT
STENT VIABAHN 10X120X8X (Permanent Stent) ×2 IMPLANT
STENT VIABAHN 8X100X120 (Permanent Stent) ×1 IMPLANT
STOPCOCK 4 WAY LG BORE MALE ST (IV SETS) ×3 IMPLANT
STOPCOCK MORSE 400PSI 3WAY (MISCELLANEOUS) ×3 IMPLANT
SUT PROLENE 5 0 C 1 24 (SUTURE) ×6 IMPLANT
SUT PROLENE 6 0 CC (SUTURE) ×21 IMPLANT
SUT PROLENE 7 0 BV 1 (SUTURE) IMPLANT
SUT PROLENE 7 0 BV1 MDA (SUTURE) IMPLANT
SUT SILK 2 0 SH (SUTURE) ×3 IMPLANT
SUT SILK 3 0 (SUTURE) ×2
SUT SILK 3-0 18XBRD TIE 12 (SUTURE) ×4 IMPLANT
SUT VIC AB 2-0 CTX 36 (SUTURE) ×6 IMPLANT
SUT VIC AB 3-0 SH 27 (SUTURE) ×8
SUT VIC AB 3-0 SH 27X BRD (SUTURE) ×16 IMPLANT
SUT VIC AB 4-0 PS2 27 (SUTURE) ×6 IMPLANT
TAPE UMBILICAL COTTON 1/8X30 (MISCELLANEOUS) IMPLANT
TOWEL OR 17X24 6PK STRL BLUE (TOWEL DISPOSABLE) ×6 IMPLANT
TOWEL OR 17X26 10 PK STRL BLUE (TOWEL DISPOSABLE) ×6 IMPLANT
TRAY FOLEY CATH 16FRSI W/METER (SET/KITS/TRAYS/PACK) ×3 IMPLANT
TUBING EXTENTION W/L.L. (IV SETS) IMPLANT
UNDERPAD 30X30 INCONTINENT (UNDERPADS AND DIAPERS) ×3 IMPLANT
WATER STERILE IRR 1000ML POUR (IV SOLUTION) ×3 IMPLANT
WIRE .035 3MM-J 145CM (WIRE) ×3 IMPLANT
WIRE AMPLATZ SS-J .035X180CM (WIRE) ×6 IMPLANT
WIRE AMPLATZ SS-J .035X260CM (WIRE) ×3 IMPLANT
WIRE HI TORQ VERSACORE J 260CM (WIRE) ×3 IMPLANT

## 2014-07-02 NOTE — Anesthesia Postprocedure Evaluation (Signed)
Anesthesia Post Note  Patient: Silvio ClaymanHoward G Latona  Procedure(s) Performed: Procedure(s) (LRB): BYPASS GRAFT RIGHT FEMORAL-POPLITEAL ARTERY using Non reverse greater Sapephenous vein. -WITH RIGHT FEMORAL ENDARTERECTOMY WITH VEIN PATCH A.NGIOPLASTY. (Right) INSERTION OF RIGHT COMMON ILIAC STENT and INSERTION OF LEFT COMMON ILIAC STENT, Ultrasound Left groin Left Femoral Puncture, (Bilateral)  Anesthesia type: general  Patient location: PACU  Post pain: Pain level controlled  Post assessment: Patient's Cardiovascular Status Stable  Last Vitals:  Filed Vitals:   07/02/14 2200  BP: 140/73  Pulse: 88  Temp: 36.7 C  Resp: 22    Post vital signs: Reviewed and stable  Level of consciousness: sedated  Complications: No apparent anesthesia complications

## 2014-07-02 NOTE — Anesthesia Preprocedure Evaluation (Addendum)
Anesthesia Evaluation  Patient identified by MRN, date of birth, ID band Patient awake    Reviewed: Allergy & Precautions, H&P , NPO status , Patient's Chart, lab work & pertinent test results  History of Anesthesia Complications (+) PONV  Airway Mallampati: II  Neck ROM: Full    Dental  (+) Teeth Intact, Dental Advisory Given   Pulmonary Current Smoker,    + decreased breath sounds      Cardiovascular hypertension, Pt. on medications + CAD, + Past MI and + Peripheral Vascular Disease Rate:Tachycardia     Neuro/Psych Anxiety  Neuromuscular disease    GI/Hepatic GERD-  ,  Endo/Other  diabetes, Type 2, Oral Hypoglycemic Agents  Renal/GU      Musculoskeletal   Abdominal   Peds  Hematology   Anesthesia Other Findings   Reproductive/Obstetrics                          Anesthesia Physical Anesthesia Plan  ASA: IV  Anesthesia Plan:    Post-op Pain Management:    Induction: Intravenous  Airway Management Planned: Oral ETT  Additional Equipment: Arterial line  Intra-op Plan:   Post-operative Plan: Extubation in OR and Possible Post-op intubation/ventilation  Informed Consent: I have reviewed the patients History and Physical, chart, labs and discussed the procedure including the risks, benefits and alternatives for the proposed anesthesia with the patient or authorized representative who has indicated his/her understanding and acceptance.   Dental advisory given  Plan Discussed with: CRNA, Surgeon and Anesthesiologist  Anesthesia Plan Comments:        Anesthesia Quick Evaluation

## 2014-07-02 NOTE — H&P (View-Only) (Signed)
[facility] [place] HISTORY AND PHYSICAL  Reason for consult: right foot pain Requesting: Montclair Jacobowitz History of Present Illness:  Patient is a 63 y.o. year old male who presents for evaluation of pain right foot. The pain has been present for approximately 2 months.  He has had some relief with Vicodin.  He has been seen several times at [facility] and treated for cellulitis with antibiotics with no improvement.  He has a long standing history of cramping in the right leg with walking which improves with rest.  He has no history of non healing wounds.  He has some numbness and tingling both feet R > L.  Risk factors include coronary artery disease (CABG 2008 right leg vein), diabetes >10 years, hypertension, +smoking (1.5 ppd) hyperlipidemia, CHF.  These problems are currently stable.  Greater than 3 minutes spent regarding smoking cessation counseling.    Past Medical History  Diagnosis Date  . Diabetes mellitus without complication   . Hypertension   . Hyperlipidemia   . CHF (congestive heart failure)   . Dermatitis, stasis     History reviewed. No pertinent past surgical history.  Social History History  Substance Use Topics  . Smoking status: Current Every Day Smoker    Types: Cigarettes  . Smokeless tobacco: Not on file  . Alcohol Use: No    Family History No family history on file.  Allergies  Allergies  Allergen Reactions  . Codeine Other (See Comments)    Chills "Made me sick to my stomach"     No current facility-administered medications for this encounter.   Current Outpatient Prescriptions  Medication Sig Dispense Refill  . amLODipine (NORVASC) 10 MG tablet Take 10 mg by mouth every morning.       . escitalopram (LEXAPRO) 10 MG tablet Take 10 mg by mouth every evening.       . gabapentin (NEURONTIN) 100 MG capsule Take 200 mg by mouth 2 (two) times daily.      Marland Kitchen glimepiride (AMARYL) 2 MG tablet Take 1 mg by mouth every  evening.      Marland Kitchen HYDROcodone-acetaminophen (NORCO/VICODIN) 5-325 MG per tablet Take 1 tablet by mouth every 4 (four) hours as needed for moderate pain.      Marland Kitchen lisinopril (PRINIVIL,ZESTRIL) 20 MG tablet Take 20 mg by mouth every evening.       . nitroGLYCERIN (NITROSTAT) 0.4 MG SL tablet Place 0.4 mg under the tongue every 5 (five) minutes as needed for chest pain.      . pravastatin (PRAVACHOL) 40 MG tablet Take 40 mg by mouth every evening.       . ranitidine (ZANTAC) 150 MG tablet Take 150 mg by mouth daily as needed for heartburn.       . sitaGLIPtin (JANUVIA) 100 MG tablet Take 50 mg by mouth every evening.        ROS:   General:  No weight loss, Fever, chills  HEENT: No recent headaches, no nasal bleeding, no visual changes, no sore throat  Neurologic: No dizziness, blackouts, seizures. No recent symptoms of stroke or mini- stroke. No recent episodes of slurred speech, or temporary blindness.  Cardiac: No recent episodes of chest pain/pressure, no shortness of breath at rest.  +shortness of breath with exertion.  Denies history of atrial fibrillation or irregular heartbeat  Vascular: + history of rest pain in feet.  + history of claudication.  No history of non-healing ulcer, No history of DVT  Pulmonary: No home oxygen, no productive cough, no hemoptysis,  No asthma or wheezing  Musculoskeletal:  [ ]  Arthritis, [ ]  Low back pain,  [ ]  Joint pain  Hematologic:No history of hypercoagulable state.  No history of easy bleeding.  No history of anemia  Gastrointestinal: No hematochezia or melena,  No gastroesophageal reflux, no trouble swallowing  Urinary: [ ]  chronic Kidney disease, [ ]  on HD - [ ]  MWF or [ ]  TTHS, [ ]  Burning with urination, [ ]  Frequent urination, [ ]  Difficulty urinating;   Skin: No rashes  Psychological: No history of anxiety,  No history of depression   Physical Examination  Filed Vitals:   06/23/14 0837 06/23/14 0913  BP: 177/93 134/82  Pulse: 115 113   Temp: 97.2 F (36.2 C) 98 F (36.7 C)  TempSrc: Oral Oral  Resp: 22 16  SpO2: 97% 100%    There is no height or weight on file to calculate BMI.  General:  Alert and oriented, no acute distress HEENT: Normal Neck: No JVD, 2+ carotid pulses Pulmonary: non labored breathing Cardiac: Regular Rate and Rhythm Abdomen: Soft, non-tender, non-distended, no mass Skin: petechial rash right foot extending to ankle reddish color, brawny staining left gaiter area Extremity Pulses:  2+ radial, brachial, absent right femoral 2+ left femoral, absent popliteal dorsalis pedis, posterior tibial pulses bilaterally Musculoskeletal: No deformity or edema  Neurologic: Upper and lower extremity motor 5/5 and symmetric  DATA:  ABI arterial duplex pending  CBC    Component Value Date/Time   WBC 6.2 06/23/2014 0929   RBC 4.69 06/23/2014 0929   HGB 14.7 06/23/2014 0929   HCT 42.3 06/23/2014 0929   PLT 207 06/23/2014 0929   MCV 90.2 06/23/2014 0929   MCH 31.3 06/23/2014 0929   MCHC 34.8 06/23/2014 0929   RDW 13.6 06/23/2014 0929   LYMPHSABS 1.4 06/23/2014 0929   MONOABS 0.4 06/23/2014 0929   EOSABS 0.1 06/23/2014 0929   BASOSABS 0.1 06/23/2014 0929    BMET    Component Value Date/Time   NA 134* 06/23/2014 0929   K 4.0 06/23/2014 0929   CL 97 06/23/2014 0929   CO2 24 06/23/2014 0929   GLUCOSE 239* 06/23/2014 0929   BUN 17 06/23/2014 0929   CREATININE 1.25 06/23/2014 0929   CALCIUM 9.1 06/23/2014 0929   GFRNONAA 60* 06/23/2014 0929   GFRAA 70* 06/23/2014 0929    ASSESSMENT:  Peripheral arterial disease most likely right iliac artery occlusion by exam most likely SFA and tibial disease as well.  Although perfusion is poor this is a chronic not acute presentation.  Will schedule for aortogram with lower extremity runoff possible intervention by my partner Dr Myra GianottiBrabham on Tuesday August 4.  Our office will contact him Monday to discuss arrival times.  Can be dc'd home today after ABI and arterial doppler completed.  Will need some  additional pain medication to last until arteriogram   PLAN:  See above  Fabienne Brunsharles Fields, MD [facility] [place] Office: [phone] Pager: [phone]

## 2014-07-02 NOTE — Op Note (Addendum)
Procedure: #1 right external iliac common femoral endarterectomy with profundoplasty (vein patch)                     #2 right common and external iliac artery stent (8 x 38, 8 x 57, 8 x 57 icast)                     #3 left external iliac artery stent (8 x 10 cm viabahn)                    #4 ultrasound left groin left femoral artery cannulation                    #5 right femoral to below-knee popliteal bypass with non-reversed ipsilateral right greater saphenous vein  Preoperative diagnosis: Rest pain right foot  Postoperative diagnosis: Same  Anesthesia: Gen.  Assistant: Gretta Began M.D., Lianne Cure PA-C  Operative details:  After obtaining informed consent, the patient was taken to the operating room. The patient was placed in supine position on the operating room table. After induction of general anesthesia and endotracheal intubation, a Foley catheter was placed. Next the patient was prepped and draped in usual sterile fashion from the umbilicus down to the right foot. A longitudinal incision was made in the right groin and carried to the saphenous tissues down to level the right common femoral artery. It was thickened and occluded. The artery was dissected free circumferentially. Several large side branches were also dissected free circumferentially and vessel loops placed around this. Dissection was carried up under the inguinal ligament and there was a pulse in the distal external iliac artery. The superficial femoral artery was dissected free circumferentially a vessel loop placed around this. The profunda femoris artery was dissected free circumferentially for several centimeters after it's origin history her arteriogram had shown that a profundoplasty was going to be necessary. At this point the greater saphenous vein was dissected free circumferentially and small side branches ligated and divided between silk ties and the medial portion incision. This is done through several skip  incisions down the leg. The vein was of good quality approximately 3 mm in diameter. Once a recent portion the saphenous below the knee the incision was deepened down to below-knee popliteal space. Bluntly popped the space was entered the popliteal artery dissected free circumferentially. The tibial peroneal trunk major tibial artery was dissected free cigarettes and vessel loops placed around these. At this point the patient was given 8000 units of heparin. He was given several other heparin boluses during the course of the case to maintain an ACT greater than 200. The profunda femoris branches were controlled with vessel loops. Superficial femoral artery was controlled a vessel loop. The distal external iliac artery was controlled with a Cooley clamp. A longitudinal opening was made in the right common femoral artery just above the femoral bifurcation. There was a large amount of plaque and subtotal occlusion of the artery. There was also some fresh clot at the femoral bifurcation. Proceeded to do a common femoral and external iliac endarterectomy. A good proximal endpoint was obtained. There was good inflow at this point. The superficial femoral artery was chronically occluded and the proximal portion of this was endarterectomized by the eversion technique. Endarterectomy was then carried several centimeters down the profunda femoris to get a good endpoint. Plaque was removed. All loose debris was removed from the arterial bed. A large side branch of the  greater saphenous vein was harvested and opened longitudinally and placed in reverse configuration. This was then sewn on as a patch angioplasty using a running 6-0 Prolene suture. Just prior completion anastomosis was forebled backbled and thoroughly flushed. Anastomosis was secured clamps released her was pulsatile flow in the right common femoral artery immediately. At this point I measured the pressure in the right common femoral artery by introducing a  23-gauge butterfly needle into the artery and comparing the right femoral pressure to the arm pressure. There was a 60 mm gradient. Set at this point that the right iliac would need to be stented. An introducer needle was used to cannulate the right common femoral artery. An 035 J-tipped guidewire was then threaded in the right iliac system. However this would not advance into the aorta. Contrast angiogram was performed and the wire was in a subintimal plane. I was unable to get this back into the true lumen. Therefore attempts to stent the right iliac system from the ipsilateral side were abandoned. At this point attention was turned to the left groin. This was prepped and draped in usual sterile fashion. Ultrasound was used to identify the left common femoral artery. This was fairly calcified anteriorly. An introducer needle was used to cannulate the left common femoral artery and an 035 versacore wire was threaded up into the abdominal aorta under fluoroscopic guidance. The guidewire was advanced up into the abdominal aorta and a 7 French bright tip sheath placed over the guidewire into the left iliac system. A 5 French Omni flush catheter was advanced over the guidewire and a contrast angiogram was obtained antegrade through the right iliac system. This showed the right common iliac was patent although severely narrowed in 2 locations of 70-80%. There was also dissection of the distal external iliac artery with a flap causing approximate 70% stenosis of the external iliac artery. At this point the Omni flush catheter was used to select the right common iliac artery. An 035 angled Glidewire was then advanced with some manipulation down into the right common femoral artery. I attempted to exchange the glide wire over a 4 French straight catheter but the Glidewire kicked back up into the aorta. This would happen several times during the course of the case. This was due to calcification at the aortic bifurcation and  a fairly narrow aortic bifurcation. I was able to advance a Glidewire back down into the right common femoral system. The Glidewire was left in place and I gently placed a 4 French straight catheter of this. This was then exchanged for an 035 Amplatz wire. Contrast angiogram was again performed to determine the precise level that we would need to stent. He had a severe disease in the right common iliac and the dissection external iliac I elected to stent from the right common iliac all the way down to the distal right external iliac. This was done with 3 overlapping i cast stents. An 8 x 57 was placed followed by an additional 8 x 57. An additional contrast angiogram was performed and there was still a 70% stenosis at the origin of the more proximal stent. Therefore an 8 x 38 I cast Stent was also placed with some overlap. All these were deployed to 10 atmospheres for 30 seconds. A completion arteriogram was performed which showed a widely patent right common and external iliac artery system with good flow into the right common femoral and into the right profunda femoris artery. Again the right superficial femoral artery  was chronically occluded. At this point I pulled the guidewires and sheath back in the left femoral system. A retrograde angiogram of the left femoral and iliac system was performed. There was a high-grade stenosis in the right external iliac artery and the 7 French sheath had been nearly totally occlusive. The versacore wire was threaded back up into the abdominal aorta. 7 French bright tip sheath was exchanged for a short 8 [place] sheath. A 8 x 10 cm 5 on stent was brought in the operative field and advanced into the external iliac artery. This was then deployed with the distal end just outside the pelvic brim. The BioLon stent was then post dilated with an 8x 80 balloon with 2 overlapping inflations to profile. Repeat contrast angiogram was performed which showed the stent was in good position on  both sides with no evidence of dissection and good flow through the external iliac artery on the left as well as good flow through the entire right iliac system on the right. There was a calcified narrowing of the left common iliac artery but this did not appear to be flow-limiting and was left.  At this point attention was turned back to the right groin. The greater saphenous vein was ligated distally with a 2-0 silk tie. The saphenofemoral junction was then oversewn with a 5-0 Prolene. The vein was placed in a nonreversible configuration. The vein was gently distended with heparinized saline. The right external iliac artery was occluded with a Cooley clamp. The profunda was controlled distally with vessel loops. A longitudinal opening was made in the hood of the pre-existing vein patch were I placed a 7 French sheath previously. The vein was spatulated and placed in a nonreversible configuration and sewn end of vein to side of artery anastomosis constructed using running 6-0 Prolene suture. Just prior to completion of the anastomosis it was forebled backbled and thoroughly flushed. The valves were all then fully lysed. There was good pulsatile flow through the vein graft. He was marked for orientation. It was then brought through a subsartorial tunnel between the heads of the gastrocnemius muscle. The below-knee popliteal artery was then controlled distally and proximally with vessel loops. A longitudinal opening was made in the below-knee popliteal artery just above the takeoff of the tibial vessels. The artery was soft and character. The leg was straightened and the vein cut to length. The vein was spatulated and sewn end of vein to side of artery using a running 6-0 Prolene suture. Just prior completion anastomosis was forebled backbled and thoroughly flushed the anastomosis was secured clamps released and there was good pulsatile flow in the below-knee popliteal artery medially. There was good Doppler flow  in the dorsalis pedis artery and a more faint posterior tibial Doppler signal these both Augmentin approximately 80% with unclamping the graft. The patient was given 50 mg of protamine. All incisions were made hemostatic. The leg incisions were closed in multiple layers or running 20 and 3-0 Vicryl suture. The groin incision was closed at the skin level after a running 2 0 and 3 0 Vicryl suture. The skin was closed with a 4 0 Vicryl subcuticular stitch. Dermabond was applied to the groin. The remainder of the leg incisions were closed staples.  The left femoral sheath was then pulled and hemostasis obtained with direct pressure. Prior to pulling the sheath the ACT was 125.  The patient tolerated the procedure well and there were no complications. The patient was extubated in the operating room  and taken the recovery room in stable condition.  Fabienne Bruns, MD [facility] [place] Office: [phone] Pager: [phone]

## 2014-07-02 NOTE — Transfer of Care (Signed)
Immediate Anesthesia Transfer of Care Note  Patient: Brent Patterson  Procedure(s) Performed: Procedure(s): BYPASS GRAFT RIGHT FEMORAL-POPLITEAL ARTERY using Non reverse greater Sapephenous vein. -WITH RIGHT FEMORAL ENDARTERECTOMY WITH VEIN PATCH A.NGIOPLASTY. (Right) INSERTION OF RIGHT COMMON ILIAC STENT and INSERTION OF LEFT COMMON ILIAC STENT, Ultrasound Left groin Left Femoral Puncture, (Bilateral)  Patient Location: PACU  Anesthesia Type:General  Level of Consciousness: awake  Airway & Oxygen Therapy: Patient Spontanous Breathing and Patient connected to face mask oxygen  Post-op Assessment: Report given to PACU RN and Post -op Vital signs reviewed and stable  Post vital signs: Reviewed and stable  Complications: No apparent anesthesia complications

## 2014-07-02 NOTE — Interval H&P Note (Signed)
History and Physical Interval Note:  07/02/2014 10:24 AM  Brent Patterson  has presented today for surgery, with the diagnosis of Bilateral Claudication right greater than left  The various methods of treatment have been discussed with the patient and family. After consideration of risks, benefits and other options for treatment, the patient has consented to  Procedure(s): BYPASS GRAFT RIGHT FEMORAL-POPLITEAL ARTERY-WITH RIGHT FEMORAL ENDARTERECTOMY (Right) POSSIBLE INSERTION OF RIGHT COMMON ILIAC STENT (Right) as a surgical intervention .  The patient's history has been reviewed, patient examined, no change in status, stable for surgery.  I have reviewed the patient's chart and labs.  Questions were answered to the patient's satisfaction.     Elisandra Deshmukh E

## 2014-07-03 ENCOUNTER — Encounter (HOSPITAL_COMMUNITY): Payer: Self-pay | Admitting: *Deleted

## 2014-07-03 DIAGNOSIS — I739 Peripheral vascular disease, unspecified: Secondary | ICD-10-CM

## 2014-07-03 LAB — GLUCOSE, CAPILLARY
GLUCOSE-CAPILLARY: 207 mg/dL — AB (ref 70–99)
Glucose-Capillary: 174 mg/dL — ABNORMAL HIGH (ref 70–99)
Glucose-Capillary: 130 mg/dL — ABNORMAL HIGH (ref 70–99)
Glucose-Capillary: 89 mg/dL (ref 70–99)
Glucose-Capillary: 136 mg/dL — ABNORMAL HIGH (ref 70–99)

## 2014-07-03 LAB — BASIC METABOLIC PANEL
ANION GAP: 17 — AB (ref 5–15)
BUN: 15 mg/dL (ref 6–23)
CHLORIDE: 99 meq/L (ref 96–112)
CO2: 20 mEq/L (ref 19–32)
Calcium: 8.2 mg/dL — ABNORMAL LOW (ref 8.4–10.5)
Creatinine, Ser: 1.33 mg/dL (ref 0.50–1.35)
GFR calc Af Amer: 65 mL/min — ABNORMAL LOW (ref >90)
GFR calc non Af Amer: 56 mL/min — ABNORMAL LOW (ref >90)
Glucose, Bld: 195 mg/dL — ABNORMAL HIGH (ref 70–99)
POTASSIUM: 4.6 meq/L (ref 3.7–5.3)
Sodium: 136 mEq/L — ABNORMAL LOW (ref 137–147)

## 2014-07-03 LAB — CBC
HEMATOCRIT: 34 % — AB (ref 39.0–52.0)
Hemoglobin: 11.6 g/dL — ABNORMAL LOW (ref 13.0–17.0)
MCH: 30.7 pg (ref 26.0–34.0)
MCHC: 34.1 g/dL (ref 30.0–36.0)
MCV: 89.9 fL (ref 78.0–100.0)
PLATELETS: 241 10*3/uL (ref 150–400)
RBC: 3.78 MIL/uL — AB (ref 4.22–5.81)
RDW: 13.9 % (ref 11.5–15.5)
WBC: 16.7 10*3/uL — AB (ref 4.0–10.5)

## 2014-07-03 LAB — HEMOGLOBIN A1C
Hgb A1c MFr Bld: 6.3 % — ABNORMAL HIGH (ref <5.7)
MEAN PLASMA GLUCOSE: 134 mg/dL — AB (ref <117)

## 2014-07-03 MED ORDER — LORAZEPAM 2 MG/ML IJ SOLN
2.0000 mg | INTRAMUSCULAR | Status: DC | PRN
  Administered 2014-07-03: 2 mg via INTRAVENOUS

## 2014-07-03 MED ORDER — LORAZEPAM 2 MG/ML IJ SOLN
INTRAMUSCULAR | Status: AC
  Administered 2014-07-03: 2 mg via INTRAVENOUS
  Filled 2014-07-03: qty 1

## 2014-07-03 NOTE — Evaluation (Signed)
Physical Therapy Evaluation Patient Details Name: Brent Patterson MRN: [id number] DOB: 10-05-1951 Today's Date: 07/03/2014   History of Present Illness  Pt adm s/p fem pop bypass with stenting on 07/02/14  Clinical Impression  Patient demonstrates deficits in functional mobility as indicated below. Will need continued skilled PT to address deficits and maximize function. Will see as indicated and progress as tolerated. Recommend HHPT upon acute discharge. OF NOTE: During session patient reported some mild dizziness and then experienced a fall in bathroom to the non-operative side while attempting to reach out for rail. PT assisted patient fully to the floor after fall then assist patient upright from floor with posterior support, nsg notified.  Patient able to ambulate back to bed with min guard after fall.  Patient fully assessed visually and through palpation, no new injuries or complaints. Vitals assessed BP stable 120s/70s.  Nsg notified MD.     Follow Up Recommendations Home health PT;Supervision for mobility/OOB    Equipment Recommendations  Rolling walker with 5" wheels    Recommendations for Other Services       Precautions / Restrictions Precautions Precautions: Fall Restrictions Weight Bearing Restrictions: No      Mobility  Bed Mobility Overal bed mobility: Needs Assistance Bed Mobility: Supine to Sit;Sit to Supine     Supine to sit: Supervision Sit to supine: Supervision      Transfers Overall transfer level: Needs assistance Equipment used: Rolling walker (2 wheeled) Transfers: Sit to/from Stand Sit to Stand: Min guard         General transfer comment: Vcs for hand placement and safety  Ambulation/Gait Ambulation/Gait assistance: Min guard Ambulation Distance (Feet): 30 Feet Assistive device: Rolling walker (2 wheeled) Gait Pattern/deviations: Step-to pattern;Decreased stride length;Antalgic Gait velocity: decreased   General Gait Details: VCs for  sequencing and safety, patient with fall during activity, min guard/min assist back to bed from bathroom  Stairs            Wheelchair Mobility    Modified Rankin (Stroke Patients Only)       Balance Overall balance assessment: Needs assistance   Sitting balance-Leahy Scale: Good     Standing balance support: During functional activity Standing balance-Leahy Scale:  (Fall during session see clinical assessment)                               Pertinent Vitals/Pain Pain Assessment: 0-10 Pain Score: 7  Pain Descriptors / Indicators: Sharp;Aching    Home Living Family/patient expects to be discharged to:: Private residence Living Arrangements: Other relatives Available Help at Discharge: Family;Available PRN/intermittently Type of Home: House Home Access: Stairs to enter Entrance Stairs-Rails: Can reach both Entrance Stairs-Number of Steps: 3 Home Layout: One level Home Equipment: Cane - single point      Prior Function Level of Independence: Independent         Comments: recent use of cane     Hand Dominance   Dominant Hand: Right    Extremity/Trunk Assessment   Upper Extremity Assessment: Overall WFL for tasks assessed           Lower Extremity Assessment: Generalized weakness;RLE deficits/detail RLE Deficits / Details: difficult to assess secondary to pain and incision       Communication   Communication: No difficulties  Cognition Arousal/Alertness: Awake/alert Behavior During Therapy: WFL for tasks assessed/performed;Flat affect Overall Cognitive Status: No family/caregiver present to determine baseline cognitive functioning  General Comments      Exercises        Assessment/Plan    PT Assessment Patient needs continued PT services  PT Diagnosis Difficulty walking;Abnormality of gait;Generalized weakness;Acute pain   PT Problem List Decreased strength;Decreased range of motion;Decreased  activity tolerance;Decreased balance;Decreased mobility;Decreased coordination;Decreased knowledge of use of DME;Decreased safety awareness;Pain  PT Treatment Interventions DME instruction;Gait training;Stair training;Functional mobility training;Therapeutic activities;Therapeutic exercise;Balance training;Patient/family education   PT Goals (Current goals can be found in the Care Plan section) Acute Rehab PT Goals Patient Stated Goal: to go home PT Goal Formulation: With patient Time For Goal Achievement: 07/17/14 Potential to Achieve Goals: Good    Frequency Min 4X/week   Barriers to discharge        Co-evaluation               End of Session Equipment Utilized During Treatment: Gait belt Activity Tolerance: Other (comment) (limited by fall) Patient left: in bed;with call bell/phone within reach;with bed alarm set Nurse Communication: Mobility status;Other (comment) Marletta Lor, MD notified)         Time: 9604-5409 PT Time Calculation (min): 26 min   Charges:   PT Evaluation $Initial PT Evaluation Tier I: 1 Procedure PT Treatments $Gait Training: 8-22 mins $Therapeutic Activity: 8-22 mins   PT G CodesFabio Asa 07/03/2014, 1:27 PM Charlotte Crumb, PT DPT  [phone]

## 2014-07-03 NOTE — Progress Notes (Signed)
07/03/2014 1145 Nursing note PT notified RN of assisted fall in BR during therapy. See progress notes from PT. Upon assessment, pt denies injury or hitting his head. Incisions WNL. No skin injury noted. Pt. Did state he felt a little dizzy and lost his balance before being lowered to the floor by PT. VS collected. Dr. [name] paged and made aware. No new orders at this time. Will continue to closely monitor patient.  Brent Patterson, Blanchard KelchStephanie Patterson

## 2014-07-03 NOTE — Progress Notes (Addendum)
Pt to TX to 2W-03, VSS, ABI in process.Called family to notify of TX.

## 2014-07-03 NOTE — Progress Notes (Addendum)
  [facility] Progress Note  07/03/2014 7:47 AM 1 Day Post-Op  Subjective:  Pain somewhat better in right foot. Alert and calm this morning. Has not ambulated.   Tmax 98.2 BP sys 130s-170s 02 99% RA   Filed Vitals:   07/03/14 0700  BP:   Pulse:   Temp: 98.2 F (36.8 C)  Resp:     Physical Exam: Incisions: Right leg dressing clean and dry.  Extremities: + right AT doppler signal. +DP/PT left doppler signals. No left groin hematoma.   CBC    Component Value Date/Time   WBC 16.7* 07/03/2014 0038   RBC 3.78* 07/03/2014 0038   HGB 11.6* 07/03/2014 0038   HCT 34.0* 07/03/2014 0038   PLT 241 07/03/2014 0038   MCV 89.9 07/03/2014 0038   MCH 30.7 07/03/2014 0038   MCHC 34.1 07/03/2014 0038   RDW 13.9 07/03/2014 0038   LYMPHSABS 1.4 06/23/2014 0929   MONOABS 0.4 06/23/2014 0929   EOSABS 0.1 06/23/2014 0929   BASOSABS 0.1 06/23/2014 0929    BMET    Component Value Date/Time   NA 136* 07/03/2014 0038   K 4.6 07/03/2014 0038   CL 99 07/03/2014 0038   CO2 20 07/03/2014 0038   GLUCOSE 195* 07/03/2014 0038   BUN 15 07/03/2014 0038   CREATININE 1.33 07/03/2014 0038   CALCIUM 8.2* 07/03/2014 0038   GFRNONAA 56* 07/03/2014 0038   GFRAA 65* 07/03/2014 0038    INR    Component Value Date/Time   INR 0.98 06/29/2014 1225     Intake/Output Summary (Last 24 hours) at 07/03/14 0747 Last data filed at 07/03/14 0600  Gross per 24 hour  Intake 4563.75 ml  Output   1350 ml  Net 3213.75 ml     Assessment:  63 y.o. male is s/p:  #1 right external iliac common femoral endarterectomy with profundoplasty (vein patch)  #2 right common and external iliac artery stent (8 x 38, 8 x 57, 8 x 57 icast)  #3 left external iliac artery stent (8 x 10 cm viabahn)  #4 ultrasound left groin left femoral artery cannulation  #5 right femoral to below-knee popliteal bypass with non-reversed ipsilateral right greater saphenous vein   1 Day Post-Op  Plan: -Bilateral doppler signals.  -Hgb  stable. -Creatine stable. -Restart ASA and plavix. -Ambulate -DVT prophylaxis:  Lovenox -Transfer to 2W  Maris BergerKimberly Trinh, PA-C [facility] Office: [phone] Pager: [phone] 07/03/2014 7:47 AM   Agree with above.  Right foot pink and warm. Brisk DP and PT doppler bilaterally.  Incisions clean no hematoma right or left groin.  Ambulate transfer to 2W  Dr Imogene Burnhen will see pt in my absence tomorrow  Fabienne Brunsharles Desi Carby, MD [facility] [place] Office: [phone] Pager: [phone]

## 2014-07-03 NOTE — Evaluation (Signed)
Occupational Therapy Evaluation Patient Details Name: Brent ClaymanHoward G Mirabella MRN: [id number] DOB: May 29, 1951 Today's Date: 07/03/2014    History of Present Illness Pt adm s/p fem pop bypass with stenting on 07/02/14   Clinical Impression   Pt admitted with above. He demonstrates the below listed deficits and will benefit from continued OT to maximize safety and independence with BADLs.  Pt requires mod A for LB ADLs due to Rt. LE pain.  He requires cues for walker safety, and will need repetition with activities to ensure carry over and safety.   May benefit from AE if pain continues to limit access to Rt. LE.   Will follow       Follow Up Recommendations  Home health OT;Supervision/Assistance - 24 hour    Equipment Recommendations  3 in 1 bedside comode;Tub/shower seat    Recommendations for Other Services       Precautions / Restrictions Precautions Precautions: Fall      Mobility Bed Mobility Overal bed mobility: Needs Assistance Bed Mobility: Supine to Sit;Sit to Supine     Supine to sit: Min assist Sit to supine: Supervision   General bed mobility comments: assist with Rt. LE  Transfers Overall transfer level: Needs assistance Equipment used: Rolling walker (2 wheeled) Transfers: Sit to/from UGI CorporationStand;Stand Pivot Transfers Sit to Stand: Min guard Stand pivot transfers: Min guard       General transfer comment: verbal cues for walker safety.    Balance Overall balance assessment: Needs assistance Sitting-balance support: Feet supported Sitting balance-Leahy Scale: Good     Standing balance support: Bilateral upper extremity supported Standing balance-Leahy Scale: Poor                              ADL Overall ADL's : Needs assistance/impaired Eating/Feeding: Independent   Grooming: Wash/dry hands;Wash/dry face;Oral care;Brushing hair;Minimal assistance;Standing   Upper Body Bathing: Set up;Sitting   Lower Body Bathing: Moderate assistance;Sit  to/from stand Lower Body Bathing Details (indicate cue type and reason): difficulty accessing Rt LE Upper Body Dressing : Set up;Sitting   Lower Body Dressing: Maximal assistance;Sit to/from stand Lower Body Dressing Details (indicate cue type and reason): pain limits access to Rt. LE Toilet Transfer: Min guard;Ambulation;Comfort height toilet   Toileting- Clothing Manipulation and Hygiene: Minimal assistance;Sit to/from stand       Functional mobility during ADLs: MicrobiologistMin guard;Rolling walker       Vision                     Perception     Praxis      Pertinent Vitals/Pain Pain Score: 6  Pain Location: Rt LE Pain Descriptors / Indicators: Aching Pain Intervention(s): Monitored during session;Repositioned     Hand Dominance Right   Extremity/Trunk Assessment Upper Extremity Assessment Upper Extremity Assessment: Overall WFL for tasks assessed   Lower Extremity Assessment Lower Extremity Assessment: Defer to PT evaluation       Communication Communication Communication: No difficulties   Cognition Arousal/Alertness: Awake/alert Behavior During Therapy: WFL for tasks assessed/performed;Flat affect Overall Cognitive Status: Within Functional Limits for tasks assessed                     General Comments       Exercises       Shoulder Instructions      Home Living Family/patient expects to be discharged to:: Private residence Living Arrangements: Other relatives Available Help at Discharge: Family;Available  PRN/intermittently Type of Home: House Home Access: Stairs to enter [employer] of Steps: 3 Entrance Stairs-Rails: Can reach both Home Layout: One level     Bathroom Shower/Tub: Tub/shower unit;Curtain Shower/tub characteristics: Curtain       Home Equipment: Cane - single point          Prior Functioning/Environment Level of Independence: Independent        Comments: recent use of cane    OT Diagnosis:  Generalized weakness;Acute pain   OT Problem List: Decreased strength;Impaired balance (sitting and/or standing);Decreased safety awareness;Decreased knowledge of use of DME or AE;Pain   OT Treatment/Interventions: Self-care/ADL training;DME and/or AE instruction;Therapeutic activities;Patient/family education;Balance training    OT Goals(Current goals can be found in the care plan section) Acute Rehab OT Goals Patient Stated Goal: to go home OT Goal Formulation: With patient Time For Goal Achievement: 07/17/14 Potential to Achieve Goals: Good ADL Goals Pt Will Perform Grooming: with supervision;standing Pt Will Perform Lower Body Bathing: with supervision;with adaptive equipment;sit to/from stand Pt Will Perform Lower Body Dressing: with supervision;with adaptive equipment;sit to/from stand Pt Will Transfer to Toilet: with supervision;ambulating;bedside commode;regular height toilet;grab bars Pt Will Perform Toileting - Clothing Manipulation and hygiene: with supervision;sit to/from stand Pt Will Perform Tub/Shower Transfer: Tub transfer;with min guard assist;rolling walker;shower seat;ambulating Additional ADL Goal #1: Pt will be able to gather ADL items from cabinets and drawers with supervision   OT Frequency: Min 2X/week   Barriers to D/C:            Co-evaluation              End of Session Equipment Utilized During Treatment: Rolling walker;Gait belt Nurse Communication: Patient requests pain meds  Activity Tolerance: Patient tolerated treatment well Patient left: in bed;with call bell/phone within reach;with bed alarm set   Time: 4098-1191 OT Time Calculation (min): 14 min Charges:  OT General Charges $OT Visit: 1 Procedure OT Evaluation $Initial OT Evaluation Tier I: 1 Procedure OT Treatments $Therapeutic Activity: 8-22 mins G-Codes:    Shaundrea Carrigg M 07/18/14, 5:11 PM

## 2014-07-03 NOTE — Progress Notes (Signed)
VASCULAR LAB PRELIMINARY  ARTERIAL  ABI completed:    RIGHT    LEFT    PRESSURE WAVEFORM  PRESSURE WAVEFORM  BRACHIAL 140 Triphasic BRACHIAL 140 Triphasic  DP 82 Monophasic DP 41 Monophasic  PT 72 Monophasic PT 62 Monophasic    RIGHT LEFT  ABI 0.59 0.41   Right ABI indicates a moderate reduction in arterial flow post operative. Left ABI indicates a severe reduction in arterial flow  Brent Patterson, RVS 07/03/2014, 10:06 AM

## 2014-07-03 NOTE — Care Management Note (Unsigned)
    Page 1 of 1   07/05/2014     4:29:04 PM CARE MANAGEMENT NOTE 07/05/2014  Patient:  Brent Patterson,[name]   Account Number:  [id number]  Date Initiated:  07/03/2014  Documentation initiated by:  Leeroy Lovings  Subjective/Objective Assessment:   Pt adm s/p fem pop bypass with stenting on 07/02/14.  PTA, pt independent, lives with sister.     Action/Plan:   Will follow for dc needs as pt progresses.   Anticipated DC Date:  07/05/2014   Anticipated DC Plan:  SKILLED NURSING FACILITY  In-house referral  Clinical Social Worker      DC Planning Services  CM consult      Choice offered to / List presented to:             Status of service:  In process, will continue to follow Medicare Important Message given?  YES (If response is "NO", the following Medicare IM given date fields will be blank) Date Medicare IM given:  07/05/2014 Medicare IM given by:  Tytiana Coles Date Additional Medicare IM given:   Additional Medicare IM given by:    Discharge Disposition:    Per UR Regulation:  Reviewed for med. necessity/level of care/duration of stay  If discussed at Long Length of Stay Meetings, dates discussed:    Comments:  07/04/14 Brent AceJulie Deaunte Dente, RN, BSN [phone] Pt with difficulty ambulating today; PT recommending SNF at dc.  Pt states he lives with sister who cannot provide 24hr care.  [place]. Will consult CSW to facilitate dc to SNF when medically stable.

## 2014-07-03 NOTE — Progress Notes (Signed)
07/03/2014 1530 Nursing note Pt. Unable to void since transfer to 2w. Pt. States he feels slightly uncomfortable. Bladder scan showed 280 ml of urine in bladder. Pt. States he is unable to void. [facility]Kim Trinh [facility] and made aware. Verbal order received to wait and additional 30 min and encourage another attempt to spontaneously void. If unsuccessful, ok to perform I&O cath. Upon pt. Re-assessment, pt. Unable to void. I&O cath performed using sterile technique yielding 450 ml urine from bladder. Pt. States complete relief. Will continue to monitor patient.  Shilee Biggs, Blanchard KelchStephanie Ingold

## 2014-07-04 LAB — GLUCOSE, CAPILLARY
Glucose-Capillary: 107 mg/dL — ABNORMAL HIGH (ref 70–99)
Glucose-Capillary: 119 mg/dL — ABNORMAL HIGH (ref 70–99)
Glucose-Capillary: 90 mg/dL (ref 70–99)
Glucose-Capillary: 122 mg/dL — ABNORMAL HIGH (ref 70–99)

## 2014-07-04 LAB — POCT I-STAT 4, (NA,K, GLUC, HGB,HCT)
GLUCOSE: 97 mg/dL (ref 70–99)
HCT: 30 % — ABNORMAL LOW (ref 39.0–52.0)
HEMOGLOBIN: 10.2 g/dL — AB (ref 13.0–17.0)
Potassium: 4.4 mEq/L (ref 3.7–5.3)
SODIUM: 135 meq/L — AB (ref 137–147)

## 2014-07-04 LAB — POCT ACTIVATED CLOTTING TIME
ACTIVATED CLOTTING TIME: 275 s
Activated Clotting Time: 123 seconds

## 2014-07-04 MED ORDER — TAMSULOSIN HCL 0.4 MG PO CAPS
0.4000 mg | ORAL_CAPSULE | Freq: Every day | ORAL | Status: DC
Start: 2014-07-04 — End: 2014-07-07
  Administered 2014-07-04 – 2014-07-07 (×4): 0.4 mg via ORAL
  Filled 2014-07-04 (×4): qty 1

## 2014-07-04 NOTE — Progress Notes (Addendum)
  [facility] Progress Note  07/04/2014 7:30 AM 2 Days Post-Op  Subjective:  Pain in right foot is a "little better." Sustained assisted fall yesterday during therapy. No injuries noted. Had difficulties voiding yesterday requiring in and out cath. Denies previous history with urination.   Tmax 98.3 BP sys 110s-130s    Filed Vitals:   07/04/14 0602  BP: 122/63  Pulse: 92  Temp: 98.2 F (36.8 C)  Resp: 18    Physical Exam: Incisions:  Right leg staple line clean dry and intact.  Extremities:  No groin hematomas bilaterally. + right AT doppler signal. Brisk DP/PT doppler signals left foot.  CBC    Component Value Date/Time   WBC 16.7* 07/03/2014 0038   RBC 3.78* 07/03/2014 0038   HGB 11.6* 07/03/2014 0038   HCT 34.0* 07/03/2014 0038   PLT 241 07/03/2014 0038   MCV 89.9 07/03/2014 0038   MCH 30.7 07/03/2014 0038   MCHC 34.1 07/03/2014 0038   RDW 13.9 07/03/2014 0038   LYMPHSABS 1.4 06/23/2014 0929   MONOABS 0.4 06/23/2014 0929   EOSABS 0.1 06/23/2014 0929   BASOSABS 0.1 06/23/2014 0929    BMET    Component Value Date/Time   NA 136* 07/03/2014 0038   K 4.6 07/03/2014 0038   CL 99 07/03/2014 0038   CO2 20 07/03/2014 0038   GLUCOSE 195* 07/03/2014 0038   BUN 15 07/03/2014 0038   CREATININE 1.33 07/03/2014 0038   CALCIUM 8.2* 07/03/2014 0038   GFRNONAA 56* 07/03/2014 0038   GFRAA 65* 07/03/2014 0038    INR    Component Value Date/Time   INR 0.98 06/29/2014 1225     Intake/Output Summary (Last 24 hours) at 07/04/14 0730 Last data filed at 07/04/14 0325  Gross per 24 hour  Intake    380 ml  Output    300 ml  Net     80 ml     Assessment:  63 y.o. male is s/p:  #1 right external iliac common femoral endarterectomy with profundoplasty (vein patch)  #2 right common and external iliac artery stent (8 x 38, 8 x 57, 8 x 57 icast)  #3 left external iliac artery stent (8 x 10 cm viabahn)  #4 ultrasound left groin left femoral artery cannulation  #5 right femoral to  below-knee popliteal bypass with non-reversed ipsilateral right greater saphenous vein  2 Days Post-Op  Plan: -Good doppler signals bilaterally. Incisions healing well. No hematoma. -Continue physical therapy.  -Will start flomax. -DVT prophylaxis:  Lovenox   Maris BergerKimberly Trinh, PA-C [facility] Office: [phone] Pager: [phone] 07/04/2014 7:30 AM  Addendum  I have independently interviewed and examined the patient, and I agree with the physician assistant's findings.  Cont PT/OT.  Suspect the patient will need SNF placement given the lack of consistent home assistance.  Pt has expressed a desire to go home however.  Will see if the patient improves over the next day or two.  Will plan d/c Fri/Sat if patient's mobility improves  Leonides SakeBrian Regan Mcbryar, MD [facility] [place] Office: [phone] Pager: [phone]  07/04/2014, 1:57 PM

## 2014-07-04 NOTE — Progress Notes (Signed)
Physical Therapy Treatment Patient Details Name: Brent Patterson MRN: [id number] DOB: 26-Jan-1951 Today's Date: 07/04/2014    History of Present Illness Pt adm s/p fem pop bypass with stenting on 07/02/14    PT Comments    Pt ambulation greatly limited by dizziness this date requiring freq sitting rest breaks. Pt with inconsistent report of available assist at home. Pt reports he is home alone during the day but also report "I could probably have my son there during the day and sister there at night." Pt unsafe to return home alone due to increased falls risk and dizziness. If 24/7 supervision not avail at home pt will need ST-SNF upon d/c to achieve safe mod I function for safe transition home.   Follow Up Recommendations  SNF (vs HHPT with 24/7 supervision)     Equipment Recommendations  Rolling walker with 5" wheels    Recommendations for Other Services       Precautions / Restrictions Precautions Precautions: Fall Restrictions Weight Bearing Restrictions: No    Mobility  Bed Mobility Overal bed mobility: Modified Independent Bed Mobility: Supine to Sit     Supine to sit: Modified independent (Device/Increase time)     General bed mobility comments: pt used bed rail, increased time  Transfers Overall transfer level: Needs assistance Equipment used: Rolling walker (2 wheeled) Transfers: Sit to/from Stand Sit to Stand: Min guard         General transfer comment: v/c's for safe hand placement/walker management  Ambulation/Gait Ambulation/Gait assistance: Min guard Ambulation Distance (Feet):  (10x2) Assistive device: Rolling walker (2 wheeled) Gait Pattern/deviations: Step-to pattern Gait velocity: decreased   General Gait Details: max v/c's for sequencing stepping with RW, pt with onset of dizziness during amb requiring to sit in chair x2   Stairs            Wheelchair Mobility    Modified Rankin (Stroke Patients Only)       Balance Overall  balance assessment: Needs assistance         Standing balance support: Single extremity supported Standing balance-Leahy Scale: Poor Standing balance comment: pt attempted to use urinal at bedside and required min guard and L UE support to maitntain balance                    Cognition Arousal/Alertness: Awake/alert Behavior During Therapy: WFL for tasks assessed/performed;Flat affect Overall Cognitive Status: Within Functional Limits for tasks assessed                      Exercises General Exercises - Lower Extremity Ankle Circles/Pumps: AROM;Both;10 reps;Seated Long Arc Quad: AROM;Right;10 reps;Seated Hip Flexion/Marching: AROM;Right;10 reps;Seated    General Comments        Pertinent Vitals/Pain Pain Assessment: 0-10 Pain Score: 5  Pain Location: R LE Pain Descriptors / Indicators: Aching Pain Intervention(s): Monitored during session (RN aware )    Home Living                      Prior Function            PT Goals (current goals can now be found in the care plan section) Progress towards PT goals: Progressing toward goals    Frequency  Min 3X/week    PT Plan Discharge plan needs to be updated;Frequency needs to be updated    Co-evaluation             End of Session Equipment Utilized During  Treatment: Gait belt Activity Tolerance:  (limited by dizziness) Patient left: in chair;with call bell/phone within reach     Time: 1000-1027 PT Time Calculation (min): 27 min  Charges:  $Gait Training: 8-22 mins $Therapeutic Exercise: 8-22 mins                    G Codes:      Marcene Brawn 07/04/2014, 11:17 AM  Lewis Shock, PT, DPT Pager #: [phone] Office #: [phone]

## 2014-07-04 NOTE — Progress Notes (Signed)
07/04/2014 11:21 AM Nursing notes Pt. Continues to be unable to spontaneously void. Bladder scan showing 270 ml of residual urine in bladder. Pt. States he is uncomfortable. [facility]. Verbal orders received to replace foley. Orders enacted and patient updated on plan of care. Will continue to monitor. Datha Kissinger, Blanchard KelchStephanie Ingold

## 2014-07-04 NOTE — Progress Notes (Signed)
Occupational Therapy Treatment Patient Details Name: Brent Patterson MRN: [id number] DOB: 1951-10-09 Today's Date: 07/04/2014    History of present illness Pt adm s/p fem pop bypass with stenting on 07/02/14   OT comments  Pain limited progression of AE education at this time. Question need for SNF for d/c planning due to living at home alone. Pt unable tolerate toilet transfer this session. Ot to continue to follow acutely.    Follow Up Recommendations  Home health OT;Supervision/Assistance - 24 hour    Equipment Recommendations  3 in 1 bedside comode;Tub/shower seat    Recommendations for Other Services      Precautions / Restrictions Precautions Precautions: Fall       Mobility Bed Mobility Overal bed mobility: Needs Assistance Bed Mobility: Sit to Supine       Sit to supine: Supervision   General bed mobility comments: incr time to progress RT LE  Transfers Overall transfer level: Needs assistance Equipment used: Rolling walker (2 wheeled) Transfers: Sit to/from Stand Sit to Stand: Min guard         General transfer comment: cues for safety and hand placement    Balance                                   ADL Overall ADL's : Needs assistance/impaired                     Lower Body Dressing: Moderate assistance;Sit to/from stand Lower Body Dressing Details (indicate cue type and reason):  pt is able to perform knee flexion of Lt Le to touch socks. pt declines education of AE for RT LE at this time             Functional mobility during ADLs: Min guard;Rolling walker General ADL Comments: Pt requesting back to bed. Pt attempting LT LE dressing task. pt (A)ed back to bed for medication. pt declining attempting toilet transfer. Pt reports to RN just put the foley back in       Vision                     Perception     Praxis      Cognition   Behavior During Therapy:  [facility] HealthWFL for tasks assessed/performed Overall  Cognitive Status: Within Functional Limits for tasks assessed                       Extremity/Trunk Assessment               Exercises     Shoulder Instructions       General Comments      Pertinent Vitals/ Pain       Pain Assessment: 0-10 Pain Score: 8  Pain Descriptors / Indicators: Constant Pain Intervention(s): Repositioned (Rn in room to medicate patient)  Home Living                                          Prior Functioning/Environment              Frequency Min 2X/week     Progress Toward Goals  OT Goals(current goals can now be found in the care plan section)  Progress towards OT goals: Not progressing toward goals - comment (due to pain)  Acute  Rehab OT Goals Patient Stated Goal: to go home OT Goal Formulation: With patient Time For Goal Achievement: 07/17/14 Potential to Achieve Goals: Good ADL Goals Pt Will Perform Grooming: with supervision;standing Pt Will Perform Lower Body Bathing: with supervision;with adaptive equipment;sit to/from stand Pt Will Perform Lower Body Dressing: with supervision;with adaptive equipment;sit to/from stand Pt Will Transfer to Toilet: with supervision;ambulating;bedside commode;regular height toilet;grab bars Pt Will Perform Toileting - Clothing Manipulation and hygiene: with supervision;sit to/from stand Pt Will Perform Tub/Shower Transfer: Tub transfer;with min guard assist;rolling walker;shower seat;ambulating Additional ADL Goal #1: Pt will be able to gather ADL items from cabinets and drawers with supervision   Plan Discharge plan remains appropriate (question need for SNF??)    Co-evaluation                 End of Session Equipment Utilized During Treatment: Gait belt;Rolling walker   Activity Tolerance Patient tolerated treatment well   Patient Left in bed;with call bell/phone within reach;with nursing/sitter in room   Nurse Communication Mobility status;Precautions         Time: 1100-1118 OT Time Calculation (min): 18 min  Charges: OT General Charges $OT Visit: 1 Procedure OT Treatments $Self Care/Home Management : 8-22 mins  Boone Master B 07/04/2014, 2:22 PM Pager: [phone]

## 2014-07-05 ENCOUNTER — Encounter (HOSPITAL_COMMUNITY): Payer: Self-pay | Admitting: Vascular Surgery

## 2014-07-05 LAB — GLUCOSE, CAPILLARY
GLUCOSE-CAPILLARY: 69 mg/dL — AB (ref 70–99)
GLUCOSE-CAPILLARY: 86 mg/dL (ref 70–99)
Glucose-Capillary: 134 mg/dL — ABNORMAL HIGH (ref 70–99)
Glucose-Capillary: 90 mg/dL (ref 70–99)

## 2014-07-05 MED ORDER — TAMSULOSIN HCL 0.4 MG PO CAPS: 0.4000 mg | ORAL_CAPSULE | Freq: Every day | ORAL | Status: AC

## 2014-07-05 MED ORDER — OXYCODONE HCL 5 MG PO TABS
5.0000 mg | ORAL_TABLET | Freq: Four times a day (QID) | ORAL | Status: DC | PRN
Start: 2014-07-05 — End: 2014-08-23

## 2014-07-05 MED ORDER — ASPIRIN 81 MG PO TABS: 81.0000 mg | ORAL_TABLET | Freq: Every day | ORAL | Status: AC

## 2014-07-05 NOTE — Discharge Summary (Signed)
[facility] Discharge Summary  Brent Patterson 1951-10-16 63 y.o. male  [id number]  Admission Date: 8/10/201[name]5  Discharge Date: 07/07/2014  Physician: [name], MD  Admission Diagnosis: Bilateral Claudication right greater than left   HPI:   This is a 63 y.o. male who presented to the [facility] office for evaluation of right foot pain. The pain has been present for approximately 2 months. He has had some relief with Vicodin. He has been seen several times at [facility] and treated for cellulitis with antibiotics with no improvement. He has a long standing history of cramping in the right leg with walking which improves with rest. He has no history of non healing wounds. He has some numbness and tingling both feet R > L. Risk factors include coronary artery disease (CABG 2008 right leg vein), diabetes >10 years, hypertension, +smoking (1.5 ppd) hyperlipidemia, CHF. These problems are currently stable.   Hospital Course:  The patient was admitted to the hospital and taken to the operating room on 07/02/2014 and underwent:  #1 right external iliac common femoral endarterectomy with profundoplasty (vein patch)  #2 right common and external iliac artery stent (8 x 38, 8 x 57, 8 x 57 icast)  #3 left external iliac artery stent (8 x 10 cm viabahn)  #4 ultrasound left groin left femoral artery cannulation  #5 right femoral to below-knee popliteal bypass with non-reversed ipsilateral right greater saphenous vein  The patient tolerated the procedure well and was transported to the PACU in stable condition. He was transferred to the stepdown unit. Overnight he became agitated and was placed on CIWA protocol.   On POD 1, the patient was calm without agitation. He had brisk doppler signals bilaterally. His incisions were clean and he had no hematomas in the groins bilaterally. He was transferred to the floor. He experienced a fall during physical therapy complaining of  dizziness. He did not sustain any injuries. He was unable to void and felt uncomfortable. He required two in and out catheterizations.   On POD 2-3, he continued to have difficulties with balance and endurance issues. He was recommended to go to SNF. He continued to have voiding issues and his foley was replaced. He was started on Flomax. His foley was discontinued on POD 3. He was unable to void the remaining of the day and required an in and out cath.  On POD 4, he reported dizziness with walking. His CBC revealed a hemoglobin of 8.3, which was down from 11.6 on POD 1. He was transfused 2 units of blood. He required another in and out cath due to a bladder scan revealing more than 600 cc of urine. A foley was replaced and urology was consulted. Instructions were given to leave a foley in and to follow up with urology as an outpatient.   He was discharged to SNF on POD 5. He will follow-up with Dr. [name] in 2 weeks.   CBC    Component Value Date/Time   WBC 16.7* 07/03/2014 0038   RBC 3.78* 07/03/2014 0038   HGB 11.6* 07/03/2014 0038   HCT 34.0* 07/03/2014 0038   PLT 241 07/03/2014 0038   MCV 89.9 07/03/2014 0038   MCH 30.7 07/03/2014 0038   MCHC 34.1 07/03/2014 0038   RDW 13.9 07/03/2014 0038   LYMPHSABS 1.4 06/23/2014 0929   MONOABS 0.4 06/23/2014 0929   EOSABS 0.1 06/23/2014 0929   BASOSABS 0.1 06/23/2014 0929    BMET    Component Value  Date/Time   NA 136* 07/03/2014 0038   K 4.6 07/03/2014 0038   CL 99 07/03/2014 0038   CO2 20 07/03/2014 0038   GLUCOSE 195* 07/03/2014 0038   BUN 15 07/03/2014 0038   CREATININE 1.33 07/03/2014 0038   CALCIUM 8.2* 07/03/2014 0038   GFRNONAA 56* 07/03/2014 0038   GFRAA 65* 07/03/2014 0038     Discharge Instructions:   The patient is discharged to SNF with extensive instructions on wound care and progressive ambulation.  They are instructed not to drive or perform any heavy lifting until returning to see the physician in his office.  Discharge Instructions   Call  MD for:  redness, tenderness, or signs of infection (pain, swelling, bleeding, redness, odor or green/yellow discharge around incision site)    Complete by:  As directed      Call MD for:  severe or increased pain, loss or decreased feeling  in affected limb(s)    Complete by:  As directed      Call MD for:  temperature >100.5    Complete by:  As directed      Driving Restrictions    Complete by:  As directed   No driving while on pain medication.     Increase activity slowly    Complete by:  As directed   Walk with assistance use walker or cane as needed     Lifting restrictions    Complete by:  As directed   No lifting for 2 weeks     Resume previous diet    Complete by:  As directed      may wash over wound with mild soap and water    Complete by:  As directed            Discharge Diagnosis:  Bilateral Claudication right greater than left  Secondary Diagnosis: Patient Active Problem List   Diagnosis Date Noted  . PAD (peripheral artery disease) 07/02/2014  . Coronary atherosclerosis of native coronary artery 06/29/2014  . Tobacco use disorder 06/29/2014  . Old myocardial infarction 06/29/2014   Past Medical History  Diagnosis Date  . Diabetes mellitus without complication   . Hypertension   . Hyperlipidemia   . CHF (congestive heart failure)   . Dermatitis, stasis   . Coronary artery disease     stents and double bypass  . Anxiety   . Myocardial infarction   . Peripheral vascular disease   . Pneumonia   . GERD (gastroesophageal reflux disease)     takes Zantac as needed  . Neuromuscular disorder     diabetic neuropathy  . Anemia   . PONV (postoperative nausea and vomiting)     n/v and dry mouth       Medication List    TAKE these medications       amLODipine 10 MG tablet  Commonly known as:  NORVASC  Take 10 mg by mouth every morning.     escitalopram 10 MG tablet  Commonly known as:  LEXAPRO  Take 10 mg by mouth every evening.     gabapentin  100 MG capsule  Commonly known as:  NEURONTIN  Take 200 mg by mouth 2 (two) times daily.     glimepiride 2 MG tablet  Commonly known as:  AMARYL  Take 1 mg by mouth every evening.     lisinopril 20 MG tablet  Commonly known as:  PRINIVIL,ZESTRIL  Take 20 mg by mouth every evening.     nitroGLYCERIN 0.4  MG SL tablet  Commonly known as:  NITROSTAT  Place 0.4 mg under the tongue every 5 (five) minutes as needed for chest pain.     oxyCODONE 5 MG immediate release tablet  Commonly known as:  Oxy IR/ROXICODONE  Take 1 tablet (5 mg total) by mouth every 6 (six) hours as needed for severe pain.     pravastatin 40 MG tablet  Commonly known as:  PRAVACHOL  Take 40 mg by mouth every evening.     ranitidine 150 MG tablet  Commonly known as:  ZANTAC  Take 150 mg by mouth daily as needed for heartburn.     sitaGLIPtin 100 MG tablet  Commonly known as:  JANUVIA  Take 50 mg by mouth every evening.     tamsulosin 0.4 MG Caps capsule  Commonly known as:  FLOMAX  Take 1 capsule (0.4 mg total) by mouth daily.     traZODone 100 MG tablet  Commonly known as:  DESYREL  Take 100 mg by mouth at bedtime as needed for sleep.      ASK your doctor about these medications       HYDROcodone-acetaminophen 5-325 MG per tablet  Commonly known as:  NORCO/VICODIN  Take 1 tablet by mouth every 4 (four) hours as needed for moderate pain.        Roxicodone #30 No Refill  Disposition: SNF  Patient's condition: is Good  Follow up: 1. Dr. Darrick Patterson in 2 weeks   Maris Berger, PA-C [facility] [phone] 07/05/2014  3:16 PM  - For VQI Registry use --- Instructions: Press F2 to tab through selections.  Delete question if not applicable.   Post-op:  Wound infection: No  Graft infection: No  Transfusion: No   New Arrhythmia: No Ipsilateral amputation: No, [ ]  Minor, [ ]  BKA, [ ]  AKA Patency judged by: [x ] Dopper only, [ ]  Palpable graft pulse, [ ]  Palpable distal  pulse, [ ]  ABI inc. > 0.15, [ ]  Duplex Discharge ABI: R 0.59, L 0.41 D/C Ambulatory Status: Ambulatory with Assistance  Complications: MI: No, [ ]  Troponin only, [ ]  EKG or Clinical CHF: No Resp failure:No, [ ]  Pneumonia, [ ]  Ventilator Chg in renal function: No, [ ]  Inc. Cr > 0.5, [ ]  Temp. Dialysis, [ ]  Permanent dialysis Stroke: No, [ ]  Minor, [ ]  Major Return to OR: No   Discharge medications: Statin use:  yes ASA use:  yes Plavix use:  no Beta blocker use: no Coumadin use: no

## 2014-07-05 NOTE — Progress Notes (Addendum)
Clinical Social Work Department CLINICAL SOCIAL WORK PLACEMENT NOTE 07/05/2014  Patient:  Brent Patterson, Brent Patterson  Account Number:  [id number] Admit date:  07/02/2014  Clinical Social Worker:  Sharol Harness, Theresia Majors  Date/time:  07/05/2014 03:45 PM  Clinical Social Work is seeking post-discharge placement for this patient at the following level of care:   SKILLED NURSING   (*CSW will update this form in Epic as items are completed)   07/05/2014  Patient/family provided with [facility] Department of Clinical Social Work's list of facilities offering this level of care within the geographic area requested by the patient (or if unable, by the patient's family).  07/05/2014  Patient/family informed of their freedom to choose among providers that offer the needed level of care, that participate in Medicare, Medicaid or managed care program needed by the patient, have an available bed and are willing to accept the patient.  07/05/2014  Patient/family informed of [facility]' ownership interest in [facility], as well as of the fact that they are under no obligation to receive care at this facility.  PASARR submitted to EDS on 07/05/2014 PASARR number received on 07/05/2014  FL2 transmitted to all facilities in geographic area requested by pt/family on   FL2 transmitted to all facilities within larger geographic area on   Patient informed that his/her managed care company has contracts with or will negotiate with  certain facilities, including the following:     Patient/family informed of bed offers received:  07/06/2014 Patient chooses bed at [facility] Physician recommends and patient chooses bed at    Patient to be transferred to [facility] on  07/07/2014 Vickii Penna, LCSWA 07/07/2014) Patient to be transferred to facility by [facility] Patient and family notified of transfer on 07/07/2014 Vickii Penna, Theresia Majors 07/07/2014) Name of family member notified:  [name], pt  sister.  Pt sister is agreeable Lynden Oxford 07/07/2014)  The following physician request were entered in Epic: Physician Request  Please sign FL2.    Additional CommentsSharol Harness, LCSWA [phone]

## 2014-07-05 NOTE — Progress Notes (Signed)
Clinical Social Work Department BRIEF PSYCHOSOCIAL ASSESSMENT 07/05/2014  Patient:  Brent Patterson,Brent Patterson     Account Number:  [id number]     Admit date:  07/02/2014  Clinical Social Worker:  Harless NakayamaAMBELAL,Ainslee Sou, LCSWA  Date/Time:  07/05/2014 02:45 PM  Referred by:  Physician  Date Referred:  07/05/2014 Referred for  SNF Placement   Other Referral:   Interview type:  Patient Other interview type:    PSYCHOSOCIAL DATA Living Status:  FAMILY Admitted from facility:   Level of care:   Primary support name:  Brent Patterson [phone] Primary support relationship to patient:  SIBLING Degree of support available:   Pt has good but limited support    CURRENT CONCERNS Current Concerns  Post-Acute Placement   Other Concerns:    SOCIAL WORK ASSESSMENT / PLAN CSW aware of PT recommendation. CSW visited pt room and pt informed CSW he was aware of recommendation. Pt lives with his sister Brent Patterson. Pt agreeable to ST rehab at SNF if he can be in [name]. Pt not familiar with any particular facilities. CSW explained referral process. Pt once again clarified that he is agreeable as long as he can stay in . CSW informed pt this should not be an issue but CSW will update with bed offers as soon as possible. Pt thankful for this. Pt only concern was not about being at rehab but about how he will get there. After speaking through options, pt informed CSW he would like non-emergent ambulance.   Assessment/plan status:  Psychosocial Support/Ongoing Assessment of Needs Other assessment/ plan:   Information/referral to community resources:   SNF list to be provided with bed offers.    PATIENT'S/FAMILY'S RESPONSE TO PLAN OF CARE: Pt pleasant and coopeartive. Pt agreeable to ST rehab.       Brent Patterson, LCSWA [phone]

## 2014-07-05 NOTE — Significant Event (Addendum)
Patient taken off oxygen to determine oxygen saturations without use of oxygen.  Oxygen left off for twenty minutes.  Pulse oximetry checked after twenty minutes, patients oxygen saturations noted to be 78% on room air.  No s/s of distress noted throughout, no c/o SOB, patient alert, oriented x 4.  Patient placed back on 2 liters nasal cannula and instructed to inhale deeply through nose.  Oxygen saturation increased to 92% on 2 liters nasal cannula.  Incentive spirometer placed at patients bedside.  Patient informed to perform exercises 10 times per hour while awake.  Patient reached 1250 with incentive spirometry usage.  Will continue to monitor patient, PA informed of patients abnormal oxygen saturations.  Reita ClicheWilliams,Casey Maxfield 07/05/2014

## 2014-07-05 NOTE — Progress Notes (Signed)
  [facility] Progress Note  07/05/2014 10:59 AM 3 Days Post-Op  Subjective:  Pain somewhat improved.   Filed Vitals:   07/05/14 0506  BP: 125/60  Pulse: 87  Temp: 98.2 F (36.8 C)  Resp: 18    Physical Exam: Incisions: staple line RLE c/d/i. No hematoma.  Extremities:  Right AT doppler signal. DP/PT doppler signals left. Some clear drainage between right 4th and 5th toe. No purulence expressed. Keep dry gauze in place.   CBC    Component Value Date/Time   WBC 16.7* 07/03/2014 0038   RBC 3.78* 07/03/2014 0038   HGB 11.6* 07/03/2014 0038   HCT 34.0* 07/03/2014 0038   PLT 241 07/03/2014 0038   MCV 89.9 07/03/2014 0038   MCH 30.7 07/03/2014 0038   MCHC 34.1 07/03/2014 0038   RDW 13.9 07/03/2014 0038   LYMPHSABS 1.4 06/23/2014 0929   MONOABS 0.4 06/23/2014 0929   EOSABS 0.1 06/23/2014 0929   BASOSABS 0.1 06/23/2014 0929    BMET    Component Value Date/Time   NA 136* 07/03/2014 0038   K 4.6 07/03/2014 0038   CL 99 07/03/2014 0038   CO2 20 07/03/2014 0038   GLUCOSE 195* 07/03/2014 0038   BUN 15 07/03/2014 0038   CREATININE 1.33 07/03/2014 0038   CALCIUM 8.2* 07/03/2014 0038   GFRNONAA 56* 07/03/2014 0038   GFRAA 65* 07/03/2014 0038    INR    Component Value Date/Time   INR 0.98 06/29/2014 1225     Intake/Output Summary (Last 24 hours) at 07/05/14 1059 Last data filed at 07/05/14 0507  Gross per 24 hour  Intake     50 ml  Output   1650 ml  Net  -1600 ml     Assessment:  63 y.o. male is s/p:  #1 right external iliac common femoral endarterectomy with profundoplasty (vein patch)  #2 right common and external iliac artery stent (8 x 38, 8 x 57, 8 x 57 icast)  #3 left external iliac artery stent (8 x 10 cm viabahn)  #4 ultrasound left groin left femoral artery cannulation  #5 right femoral to below-knee popliteal bypass with non-reversed ipsilateral right greater saphenous vein  3 Days Post-Op  Plan: -Bilateral doppler signals. Incisions healing.  -Continue  PT/OT. Patient is agreeable to SNF.  -Discontinue foley. Continue flomax.  -DVT prophylaxis:  Lovenox -Dispo: Anticipate discharge to SNF tomorrow.    Maris BergerKimberly Trinh, PA-C [facility] Office: [phone] Pager: [phone] 07/05/2014 10:59 AM  Addendum  I have independently interviewed and examined the patient, and I agree with the physician assistant's findings.  Warm R foot, incision c/d/i, staples in place.  ABI as previously documented.  Pt appears to be amendable to SNF placement.  Leonides SakeBrian Chen, MD [facility] [place] Office: [phone] Pager: [phone]  07/05/2014, 11:59 AM

## 2014-07-05 NOTE — Progress Notes (Signed)
Patient foley catheter discontinue at 2pm per physician order.  Patient due to void.  Bladder scan performed, patient noted to have greater than 134cc in bladder per bladder scan.  No s/s of distress noted or verbalized, no discomfort verbalized throughout, patient states that he does not have the urge to void at this moment.  Will endorse information to night nurse.  Reita ClicheWilliams,Gizella [place]

## 2014-07-05 NOTE — Progress Notes (Signed)
Physical Therapy Treatment Patient Details Name: Brent ClaymanHoward G Patterson MRN: [id number] DOB: 29-Nov-1950 Today's Date: 07/05/2014    History of Present Illness Pt adm s/p fem pop bypass with stenting on 07/02/14    PT Comments    Pt admitted with above. Pt currently with functional limitations due to balance and endurance deficits. Pt also desats with activity which possibly causes dizziness vs. BP.  Was not significantly orthostatic however blood pressure did drop slightly when pt standing. Needs NHP to incr endurance and safety prior to d/c home.  Pt will benefit from skilled PT to increase their independence and safety with mobility to allow discharge to the venue listed below.   Follow Up Recommendations  SNF;Supervision/Assistance - 24 hour     Equipment Recommendations  Rolling walker with 5" wheels    Recommendations for Other Services       Precautions / Restrictions Precautions Precautions: Fall Restrictions Weight Bearing Restrictions: No    Mobility  Bed Mobility Overal bed mobility: Needs Assistance Bed Mobility: Supine to Sit     Supine to sit: Modified independent (Device/Increase time)     General bed mobility comments: incr time to progress RT LE  Transfers Overall transfer level: Needs assistance Equipment used: Rolling walker (2 wheeled) Transfers: Sit to/from Stand Sit to Stand: Min guard         General transfer comment: cues for safety and hand placement  Ambulation/Gait Ambulation/Gait assistance: Min guard Ambulation Distance (Feet): 35 Feet (25 feet and then 10 feet) Assistive device: Rolling walker (2 wheeled) Gait Pattern/deviations: Step-to pattern;Decreased stride length;Antalgic Gait velocity: decreased Gait velocity interpretation: Below normal speed for age/gender General Gait Details: Required 1 seated rest break due to fatigue and dizziness.  Desat to 84% on RA therefore reapplied O2 at 2L with sat 90-93%.  Max verbal cues for sequencing  and stepping with RW as well as steering of RW.     Stairs            Wheelchair Mobility    Modified Rankin (Stroke Patients Only)       Balance Overall balance assessment: Needs assistance;History of Falls Sitting-balance support: Feet supported;No upper extremity supported Sitting balance-Leahy Scale: Good     Standing balance support: Bilateral upper extremity supported;During functional activity Standing balance-Leahy Scale: Poor Standing balance comment: Pt requires use of bil UEs for support in standing with RW.  Needs guard assist for safety even with RW due to decr awareness at times.                     Cognition Arousal/Alertness: Awake/alert Behavior During Therapy: WFL for tasks assessed/performed Overall Cognitive Status: Within Functional Limits for tasks assessed                      Exercises General Exercises - Lower Extremity Ankle Circles/Pumps: AROM;Both;10 reps;Seated Long Arc Quad: AROM;Right;10 reps;Seated Hip Flexion/Marching: AROM;Right;10 reps;Seated    General Comments        Pertinent Vitals/Pain Pain Assessment: 0-10 Pain Score: 6  Pain Location: RLE Pain Descriptors / Indicators: Constant Pain Intervention(s): Monitored during session;Repositioned Sats 84% on RA with activity.  REplaced O2 at 2L with sats 90-93% with 2LO2.  BP 117/64 in sitting and 109/63 in standing.  Will continue to monitor pts dizziness.      Home Living                      Prior Function  PT Goals (current goals can now be found in the care plan section) Progress towards PT goals: Progressing toward goals    Frequency  Min 3X/week    PT Plan Current plan remains appropriate    Co-evaluation             End of Session Equipment Utilized During Treatment: Gait belt;Oxygen Activity Tolerance: Patient limited by fatigue Patient left: in chair;with call bell/phone within reach     Time: 0915-0948 PT Time  Calculation (min): 33 min  Charges:  $Gait Training: 8-22 mins $Therapeutic Exercise: 8-22 mins                    G Codes:      INGOLD,Saud Bail 2014/07/08, 10:01 AM [facility] [phone] (pager)

## 2014-07-06 DIAGNOSIS — D62 Acute posthemorrhagic anemia: Secondary | ICD-10-CM | POA: Diagnosis not present

## 2014-07-06 LAB — GLUCOSE, CAPILLARY
Glucose-Capillary: 82 mg/dL (ref 70–99)
Glucose-Capillary: 80 mg/dL (ref 70–99)
Glucose-Capillary: 84 mg/dL (ref 70–99)
Glucose-Capillary: 81 mg/dL (ref 70–99)

## 2014-07-06 LAB — CBC
HCT: 25.1 % — ABNORMAL LOW (ref 39.0–52.0)
HEMOGLOBIN: 8.3 g/dL — AB (ref 13.0–17.0)
MCH: 30.4 pg (ref 26.0–34.0)
MCHC: 33.1 g/dL (ref 30.0–36.0)
MCV: 91.9 fL (ref 78.0–100.0)
PLATELETS: 220 10*3/uL (ref 150–400)
RBC: 2.73 MIL/uL — AB (ref 4.22–5.81)
RDW: 13.7 % (ref 11.5–15.5)
WBC: 6.4 10*3/uL (ref 4.0–10.5)

## 2014-07-06 LAB — BASIC METABOLIC PANEL
ANION GAP: 13 (ref 5–15)
BUN: 18 mg/dL (ref 6–23)
CHLORIDE: 103 meq/L (ref 96–112)
CO2: 22 mEq/L (ref 19–32)
Calcium: 7.8 mg/dL — ABNORMAL LOW (ref 8.4–10.5)
Creatinine, Ser: 1.41 mg/dL — ABNORMAL HIGH (ref 0.50–1.35)
GFR calc Af Amer: 60 mL/min — ABNORMAL LOW (ref >90)
GFR calc non Af Amer: 52 mL/min — ABNORMAL LOW (ref >90)
GLUCOSE: 76 mg/dL (ref 70–99)
Potassium: 4.9 mEq/L (ref 3.7–5.3)
Sodium: 138 mEq/L (ref 137–147)

## 2014-07-06 LAB — PREPARE RBC (CROSSMATCH)

## 2014-07-06 MED ORDER — SODIUM CHLORIDE 0.9 % IV SOLN
Freq: Once | INTRAVENOUS | Status: AC
  Administered 2014-07-06: 11:00:00 via INTRAVENOUS

## 2014-07-06 NOTE — Progress Notes (Signed)
[facility] Progress Note  07/06/2014 7:42 AM 4 Days Post-Op  Subjective:  Complaining of dizziness when getting out of bed. Unable to void yesterday since foley discontinued around 1400. Had one in and out cath. Complaining of pain around right foot. Per RN, 02 sats have been in upper 70s without nasal cannula. Denies chest pain, shortness of breath, productive cough. Has not had bowel movement.   Tmax 99.2 BP sys 120s-130s Pulse 80s  02 90% 2L  Filed Vitals:   07/06/14 0459  BP: 127/60  Pulse: 84  Temp: 98 F (36.7 C)  Resp: 18    Physical Exam: Incisions:  Right staple line c/d/i, Right and left groins without hematoma Extremities:  Brisk right AT doppler signal, +DP/PT doppler signals   CBC    Component Value Date/Time   WBC 6.4 07/06/2014 0350   RBC 2.73* 07/06/2014 0350   HGB 8.3* 07/06/2014 0350   HCT 25.1* 07/06/2014 0350   PLT 220 07/06/2014 0350   MCV 91.9 07/06/2014 0350   MCH 30.4 07/06/2014 0350   MCHC 33.1 07/06/2014 0350   RDW 13.7 07/06/2014 0350   LYMPHSABS 1.4 06/23/2014 0929   MONOABS 0.4 06/23/2014 0929   EOSABS 0.1 06/23/2014 0929   BASOSABS 0.1 06/23/2014 0929    BMET    Component Value Date/Time   NA 138 07/06/2014 0350   K 4.9 07/06/2014 0350   CL 103 07/06/2014 0350   CO2 22 07/06/2014 0350   GLUCOSE 76 07/06/2014 0350   BUN 18 07/06/2014 0350   CREATININE 1.41* 07/06/2014 0350   CALCIUM 7.8* 07/06/2014 0350   GFRNONAA 52* 07/06/2014 0350   GFRAA 60* 07/06/2014 0350    INR    Component Value Date/Time   INR 0.98 06/29/2014 1225     Intake/Output Summary (Last 24 hours) at 07/06/14 0742 Last data filed at 07/06/14 0320  Gross per 24 hour  Intake    240 ml  Output    650 ml  Net   -410 ml     Assessment:  63 y.o. male is s/p:  #1 right external iliac common femoral endarterectomy with profundoplasty (vein patch)  #2 right common and external iliac artery stent (8 x 38, 8 x 57, 8 x 57 icast)  #3 left external iliac artery  stent (8 x 10 cm viabahn)  #4 ultrasound left groin left femoral artery cannulation  #5 right femoral to below-knee popliteal bypass with non-reversed ipsilateral right greater saphenous vein  4 Days Post-Op  Plan: -Bilateral doppler signals and incisions healing well.  -Anemia: Hgb today 8.3. Down from 11.6 on POD 1. Likely surgical blood loss anemia. No signs of bleeding. Will transfuse 2 units today. -Continue flomax. Has been unable to void since surgery requiring in and out cath yesterday. Will replace foley. Urology consulted and instructed to leave foley in and follow-up with urology as an outpatient.  -Encourage IS -Continue PT/OT -DVT prophylaxis:  Lovenox -Dispo: Will see how patient does following blood transfusion and possible d/c to SNF later today.   Maris Berger, PA-C [facility] Office: [phone] Pager: [phone] 07/06/2014 7:42 AM     Addendum  I have independently interviewed and examined the patient, and I agree with the physician assistant's findings.  Agree with blood transfusion given pt's sx.  Would delay any transfer to SNF until tomorrow to watch for any blood transfusion reactions.  Urology c/s obtained today.  They recommend placing foley and follow up in office.  Leonides SakeBrian Helaina Stefano, MD [facility] [place] Office: [phone] Pager: [phone]  07/06/2014, 2:17 PM

## 2014-07-06 NOTE — Progress Notes (Signed)
CIWA assessments not done due to Pt does not endorse any alcohol use or other drugs of abuse.  Has scored zero on the CIWA scale last 3 times.  Will con't plan of care.

## 2014-07-06 NOTE — Progress Notes (Signed)
CSW (Clinical Child psychotherapistocial Worker) provided pt with bed offers. Pt has accepted bed at [facility]. CSW notified facility and insurance of potential for weekend dc.  Mason Dibiasio, LCSWA [phone]

## 2014-07-06 NOTE — Progress Notes (Signed)
CARE MANAGEMENT NOTE 07/06/2014  Patient:  Brent Patterson,Brent Patterson   Account Number:  [id number]  Date Initiated:  07/03/2014  Documentation initiated by:  AMERSON,JULIE  Subjective/Objective Assessment:   Pt adm s/p fem pop bypass with stenting on 07/02/14.  PTA, pt independent, lives with sister.     Action/Plan:   Will follow for dc needs as pt progresses.   Anticipated DC Date:  07/05/2014   Anticipated DC Plan:  SKILLED NURSING FACILITY  In-house referral  Clinical Social Worker      DC Planning Services  CM consult      Choice offered to / List presented to:             Status of service:  Completed, signed off Medicare Important Message given?  YES (If response is "NO", the following Medicare IM given date fields will be blank) Date Medicare IM given:  07/05/2014 Medicare IM given by:  AMERSON,JULIE Date Additional Medicare IM given:   Additional Medicare IM given by:    Discharge Disposition:  SKILLED NURSING FACILITY  Per UR Regulation:  Reviewed for med. necessity/level of care/duration of stay  If discussed at Long Length of Stay Meetings, dates discussed:    Comments:  07/06/2014 1400 No additional NCM needs identified. Dc to SNF planned. Isidoro DonningAlesia [place] RN CCM Case Mgmt phone [phone]   07/04/14 Sidney AceJulie Amerson, RN, BSN [phone] Pt with difficulty ambulating today; PT recommending SNF at dc.  Pt states he lives with sister who cannot provide 24hr care.  He is agreeable to poss SNF for rehab in [place]. Will consult CSW to facilitate dc to SNF when medically stable.

## 2014-07-06 NOTE — Progress Notes (Signed)
Patient has been unable to void since foley catheter removal.Bladder scanned for 400 ml of urine.In and cath per protocol for for 350 ml of amber urine .Patient tolerated procedure well.Will continue to monitor for urine output.

## 2014-07-06 NOTE — Clinical Documentation Improvement (Signed)
07/06/14 progr note..."4 Days Post-Op"..."Hgb today 8.3. Down from 11.6 on POD 1. No signs of bleeding. Will transfuse 2 units today." For accurate Dx specificity & severity can noted clinical values and treatment be further specified with clinical conditon being eval'd, mon'd & tx'd. Thank you  Possible Clinical Conditions?   Expected Acute Blood Loss Anemia  Acute Blood Loss Anemia  Acute on chronic blood loss anemia  Chronic blood loss anemia  Precipitous drop in Hematocrit  Other Condition  Cannot Clinically Determine  Supporting Information: Risk Factors: (recent surgery, pre op anemia, EBL in OR): See above note Signs and Symptoms: See above note Diagnostics: See above note Treatments: See above note  Thank You, Toribio Harbourphelia R Talullah Abate, RN, BSN, CCDS Certified Clinical Documentation Specialist Pager: [phone] [place]: Health Information Management

## 2014-07-07 LAB — BASIC METABOLIC PANEL
Anion gap: 14 (ref 5–15)
BUN: 21 mg/dL (ref 6–23)
CO2: 22 mEq/L (ref 19–32)
Calcium: 8.4 mg/dL (ref 8.4–10.5)
Chloride: 104 mEq/L (ref 96–112)
Creatinine, Ser: 1.31 mg/dL (ref 0.50–1.35)
GFR, EST AFRICAN AMERICAN: 66 mL/min — AB (ref >90)
GFR, EST NON AFRICAN AMERICAN: 57 mL/min — AB (ref >90)
Glucose, Bld: 69 mg/dL — ABNORMAL LOW (ref 70–99)
Potassium: 4.9 mEq/L (ref 3.7–5.3)
Sodium: 140 mEq/L (ref 137–147)

## 2014-07-07 LAB — CBC
HCT: 32.4 % — ABNORMAL LOW (ref 39.0–52.0)
Hemoglobin: 10.9 g/dL — ABNORMAL LOW (ref 13.0–17.0)
MCH: 29.5 pg (ref 26.0–34.0)
MCHC: 33.6 g/dL (ref 30.0–36.0)
MCV: 87.6 fL (ref 78.0–100.0)
Platelets: 276 10*3/uL (ref 150–400)
RBC: 3.7 MIL/uL — ABNORMAL LOW (ref 4.22–5.81)
RDW: 14.8 % (ref 11.5–15.5)
WBC: 7.4 10*3/uL (ref 4.0–10.5)

## 2014-07-07 LAB — GLUCOSE, CAPILLARY
GLUCOSE-CAPILLARY: 87 mg/dL (ref 70–99)
Glucose-Capillary: 66 mg/dL — ABNORMAL LOW (ref 70–99)
Glucose-Capillary: 96 mg/dL (ref 70–99)

## 2014-07-07 NOTE — Progress Notes (Addendum)
     Subjective  - My leg still hurts and I'm nauseated.  Foley in place.     Objective 135/62 85 98.4 F (36.9 C) (Oral) 19 91%  Intake/Output Summary (Last 24 hours) at 07/07/14 0846 Last data filed at 07/07/14 0130  Gross per 24 hour  Intake  604.6 ml  Output      0 ml  Net  604.6 ml    Right LE incisions C/D/I  Groin soft without hematoma Doppler signals PT/DP/AT right LE  Assessment/Planning: POD # 5 #1 right external iliac common femoral endarterectomy with profundoplasty (vein patch)  #2 right common and external iliac artery stent (8 x 38, 8 x 57, 8 x 57 icast)  #3 left external iliac artery stent (8 x 10 cm viabahn)  #4 ultrasound left groin left femoral artery cannulation  #5 right femoral to below-knee popliteal bypass with non-reversed ipsilateral right greater saphenous vein  -Continue flomax. Has been unable to void since surgery requiring in and out cath yesterday. Will replace foley. Urology consulted and instructed to leave foley in and follow-up with urology as an outpatient.  -HGB 10.9 after 2 units PRBC -Plan SNF   Clinton GallantCOLLINS, EMMA [facility] 07/07/2014 8:46 AM --  Laboratory Lab Results:  Recent Labs  07/06/14 0350 07/07/14 0403  WBC 6.4 7.4  HGB 8.3* 10.9*  HCT 25.1* 32.4*  PLT 220 276   BMET  Recent Labs  07/06/14 0350 07/07/14 0403  NA 138 140  K 4.9 4.9  CL 103 104  CO2 22 22  GLUCOSE 76 69*  BUN 18 21  CREATININE 1.41* 1.31  CALCIUM 7.8* 8.4    COAG Lab Results  Component Value Date   INR 0.98 06/29/2014   No results found for this basename: PTT   Addendum  I have independently interviewed and examined the patient, and I agree with the physician assistant's findings.  Appropriate H/H after transfusion.  Easily dopplerable PT, inc c/d/i staples in place.  Staples out in 10 days.  Ok to transfer to SNF  Leonides SakeBrian Kinda Pottle, MD [facility] [place] Office: [phone] Pager: [phone]  07/07/2014,  11:39 AM

## 2014-07-07 NOTE — Clinical Social Work Note (Addendum)
1:30pm- CSW called for transportation.  RN aware.  DC packet is on chart for [facility].    CSW spoke with Ezequiel EssexJocelyn at [facility] who confirmed admission for this pt with Admission's Coordinator.  RN requests to set transportation time for 1pm.  CSW spoke with sister, Misty StanleyLisa to make aware of discharge.    [facility] Report #: [phone] Transportation: [facility] - CSW to schedule for 1pm per RN request.  Vickii PennaGina Kerrilynn Derenzo, LCSWA [phone] (weekend coverage)  Clinical Social Work

## 2014-07-09 LAB — TYPE AND SCREEN
ABO/RH(D): O POS
Antibody Screen: NEGATIVE
UNIT DIVISION: 0
Unit division: 0

## 2014-07-23 ENCOUNTER — Encounter (HOSPITAL_COMMUNITY): Payer: Self-pay

## 2014-07-23 ENCOUNTER — Encounter: Payer: Self-pay | Admitting: Surgery

## 2014-07-25 ENCOUNTER — Encounter: Payer: Self-pay | Admitting: Vascular Surgery

## 2014-07-26 ENCOUNTER — Encounter: Payer: Medicare HMO | Admitting: Vascular Surgery

## 2014-07-26 ENCOUNTER — Ambulatory Visit (INDEPENDENT_AMBULATORY_CARE_PROVIDER_SITE_OTHER): Payer: Commercial Managed Care - HMO | Admitting: Vascular Surgery

## 2014-07-26 ENCOUNTER — Encounter: Payer: Self-pay | Admitting: Vascular Surgery

## 2014-07-26 VITALS — BP 125/66 | HR 74 | Temp 97.7°F | Resp 16 | Ht 69.0 in | Wt 151.3 lb

## 2014-07-26 DIAGNOSIS — I739 Peripheral vascular disease, unspecified: Secondary | ICD-10-CM

## 2014-07-26 DIAGNOSIS — Z48812 Encounter for surgical aftercare following surgery on the circulatory system: Secondary | ICD-10-CM

## 2014-07-26 DIAGNOSIS — L98499 Non-pressure chronic ulcer of skin of other sites with unspecified severity: Principal | ICD-10-CM

## 2014-07-26 DIAGNOSIS — I7025 Atherosclerosis of native arteries of other extremities with ulceration: Secondary | ICD-10-CM | POA: Insufficient documentation

## 2014-07-26 NOTE — Progress Notes (Signed)
Patient is a 63 year old male who returns for postoperative followup today. He returned from a skilled nursing facility to his home yesterday. He underwent right common and external iliac artery stenting as well as left external iliac artery stenting and right femoral to below-knee popliteal bypass with vein several weeks ago. He reports no drainage from incisions. He states he is walking somewhat claudication. He states that the pain in his right foot is significantly improved. Unfortunately he continues to smoke.  Physical exam:  Filed Vitals:   07/26/14 0912  BP: 125/66  Pulse: 74  Temp: 97.7 F (36.5 C)  TempSrc: Oral  Resp: 16  Height:  (1.753 m)  Weight: 151 lb 4.8 oz (68.629 kg)  SpO2: 97%    Right lower extremity: Right groin incision healing with some maceration at the inferior aspect all other incisions completely healed right foot no palpable pedal pulses but triphasic dorsalis pedis Doppler and biphasic posterior tibial Doppler still some trace edema in the right lower extremity  Left lower extremity: 2+ left femoral pulse no ulcerations left foot  Assessment: Patent stents in bypass graft right lower extremity left lower extremity  Plan: Followup in one month to recheck the wound in his right groin. He will then followup in 3 months with a duplex scan of the right lower extremity bypass graft and bilateral ABIs. He will try to quit smoking.  Fabienne Bruns, MD [facility] [place] Office: [phone] Pager: [phone]

## 2014-07-27 ENCOUNTER — Encounter: Payer: Self-pay | Admitting: Vascular Surgery

## 2014-08-03 ENCOUNTER — Encounter: Payer: Self-pay | Admitting: Family

## 2014-08-03 ENCOUNTER — Ambulatory Visit (INDEPENDENT_AMBULATORY_CARE_PROVIDER_SITE_OTHER): Payer: Self-pay | Admitting: Family

## 2014-08-03 VITALS — BP 133/71 | HR 63 | Temp 97.5°F | Resp 16 | Ht 69.0 in | Wt 157.2 lb

## 2014-08-03 DIAGNOSIS — Z48812 Encounter for surgical aftercare following surgery on the circulatory system: Secondary | ICD-10-CM

## 2014-08-03 DIAGNOSIS — T8189XA Other complications of procedures, not elsewhere classified, initial encounter: Secondary | ICD-10-CM

## 2014-08-03 NOTE — Patient Instructions (Signed)
Peripheral Vascular Disease Peripheral Vascular Disease (PVD), also called Peripheral Arterial Disease (PAD), is a circulation problem caused by cholesterol (atherosclerotic plaque) deposits in the arteries. PVD commonly occurs in the lower extremities (legs) but it can occur in other areas of the body, such as your arms. The cholesterol buildup in the arteries reduces blood flow which can cause pain and other serious problems. The presence of PVD can place a person at risk for Coronary Artery Disease (CAD).  CAUSES  Causes of PVD can be many. It is usually associated with more than one risk factor such as:   High Cholesterol.  Smoking.  Diabetes.  Lack of exercise or inactivity.  High blood pressure (hypertension).  Obesity.  Family history. SYMPTOMS   When the lower extremities are affected, patients with PVD may experience:  Leg pain with exertion or physical activity. This is called INTERMITTENT CLAUDICATION. This may present as cramping or numbness with physical activity. The location of the pain is associated with the level of blockage. For example, blockage at the abdominal level (distal abdominal aorta) may result in buttock or hip pain. Lower leg arterial blockage may result in calf pain.  As PVD becomes more severe, pain can develop with less physical activity.  In people with severe PVD, leg pain may occur at rest.  Other PVD signs and symptoms:  Leg numbness or weakness.  Coldness in the affected leg or foot, especially when compared to the other leg.  A change in leg color.  Patients with significant PVD are more prone to ulcers or sores on toes, feet or legs. These may take longer to heal or may reoccur. The ulcers or sores can become infected.  If signs and symptoms of PVD are ignored, gangrene may occur. This can result in the loss of toes or loss of an entire limb.  Not all leg pain is related to PVD. Other medical conditions can cause leg pain such  as:  Blood clots (embolism) or Deep Vein Thrombosis.  Inflammation of the blood vessels (vasculitis).  Spinal stenosis. DIAGNOSIS  Diagnosis of PVD can involve several different types of tests. These can include:  Pulse Volume Recording Method (PVR). This test is simple, painless and does not involve the use of X-rays. PVR involves measuring and comparing the blood pressure in the arms and legs. An ABI (Ankle-Brachial Index) is calculated. The normal ratio of blood pressures is 1. As this number becomes smaller, it indicates more severe disease.  < 0.95 - indicates significant narrowing in one or more leg vessels.  <0.8 - there will usually be pain in the foot, leg or buttock with exercise.  <0.4 - will usually have pain in the legs at rest.  <0.25 - usually indicates limb threatening PVD.  Doppler detection of pulses in the legs. This test is painless and checks to see if you have a pulses in your legs/feet.  A dye or contrast material (a substance that highlights the blood vessels so they show up on x-ray) may be given to help your caregiver better see the arteries for the following tests. The dye is eliminated from your body by the kidney's. Your caregiver may order blood work to check your kidney function and other laboratory values before the following tests are performed:  Magnetic Resonance Angiography (MRA). An MRA is a picture study of the blood vessels and arteries. The MRA machine uses a large magnet to produce images of the blood vessels.  Computed Tomography Angiography (CTA). A CTA   is a specialized x-ray that looks at how the blood flows in your blood vessels. An IV may be inserted into your arm so contrast dye can be injected.  Angiogram. Is a procedure that uses x-rays to look at your blood vessels. This procedure is minimally invasive, meaning a small incision (cut) is made in your groin. A small tube (catheter) is then inserted into the artery of your groin. The catheter  is guided to the blood vessel or artery your caregiver wants to examine. Contrast dye is injected into the catheter. X-rays are then taken of the blood vessel or artery. After the images are obtained, the catheter is taken out. TREATMENT  Treatment of PVD involves many interventions which may include:  Lifestyle changes:  Quitting smoking.  Exercise.  Following a low fat, low cholesterol diet.  Control of diabetes.  Foot care is very important to the PVD patient. Good foot care can help prevent infection.  Medication:  Cholesterol-lowering medicine.  Blood pressure medicine.  Anti-platelet drugs.  Certain medicines may reduce symptoms of Intermittent Claudication.  Interventional/Surgical options:  Angioplasty. An Angioplasty is a procedure that inflates a balloon in the blocked artery. This opens the blocked artery to improve blood flow.  Stent Implant. A wire mesh tube (stent) is placed in the artery. The stent expands and stays in place, allowing the artery to remain open.  Peripheral Bypass Surgery. This is a surgical procedure that reroutes the blood around a blocked artery to help improve blood flow. This type of procedure may be performed if Angioplasty or stent implants are not an option. SEEK IMMEDIATE MEDICAL CARE IF:   You develop pain or numbness in your arms or legs.  Your arm or leg turns cold, becomes blue in color.  You develop redness, warmth, swelling and pain in your arms or legs. MAKE SURE YOU:   Understand these instructions.  Will watch your condition.  Will get help right away if you are not doing well or get worse. Document Released: 12/17/2004 Document Revised: 02/01/2012 Document Reviewed: 11/13/2008 ExitCare Patient Information 2015 [employer]. This information is not intended to replace advice given to you by your health care provider. Make sure you discuss any questions you have with your health care provider.   Smoking  Cessation Quitting smoking is important to your health and has many advantages. However, it is not always easy to quit since nicotine is a very addictive drug. Oftentimes, people try 3 times or more before being able to quit. This document explains the best ways for you to prepare to quit smoking. Quitting takes hard work and a lot of effort, but you can do it. ADVANTAGES OF QUITTING SMOKING  You will live longer, feel better, and live better.  Your body will feel the impact of quitting smoking almost immediately.  Within 20 minutes, blood pressure decreases. Your pulse returns to its normal level.  After 8 hours, carbon monoxide levels in the blood return to normal. Your oxygen level increases.  After 24 hours, the chance of having a heart attack starts to decrease. Your breath, hair, and body stop smelling like smoke.  After 48 hours, damaged nerve endings begin to recover. Your sense of taste and smell improve.  After 72 hours, the body is virtually free of nicotine. Your bronchial tubes relax and breathing becomes easier.  After 2 to 12 weeks, lungs can hold more air. Exercise becomes easier and circulation improves.  The risk of having a heart attack, stroke, cancer,   or lung disease is greatly reduced.  After 1 year, the risk of coronary heart disease is cut in half.  After 5 years, the risk of stroke falls to the same as a nonsmoker.  After 10 years, the risk of lung cancer is cut in half and the risk of other cancers decreases significantly.  After 15 years, the risk of coronary heart disease drops, usually to the level of a nonsmoker.  If you are pregnant, quitting smoking will improve your chances of having a healthy baby.  The people you live with, especially any children, will be healthier.  You will have extra money to spend on things other than cigarettes. QUESTIONS TO THINK ABOUT BEFORE ATTEMPTING TO QUIT You may want to talk about your answers with your health care  provider.  Why do you want to quit?  If you tried to quit in the past, what helped and what did not?  What will be the most difficult situations for you after you quit? How will you plan to handle them?  Who can help you through the tough times? Your family? Friends? A health care provider?  What pleasures do you get from smoking? What ways can you still get pleasure if you quit? Here are some questions to ask your health care provider:  How can you help me to be successful at quitting?  What medicine do you think would be best for me and how should I take it?  What should I do if I need more help?  What is smoking withdrawal like? How can I get information on withdrawal? GET READY  Set a quit date.  Change your environment by getting rid of all cigarettes, ashtrays, matches, and lighters in your home, car, or work. Do not let people smoke in your home.  Review your past attempts to quit. Think about what worked and what did not. GET SUPPORT AND ENCOURAGEMENT You have a better chance of being successful if you have help. You can get support in many ways.  Tell your family, friends, and coworkers that you are going to quit and need their support. Ask them not to smoke around you.  Get individual, group, or telephone counseling and support. Programs are available at local hospitals and health centers. Call your local health department for information about programs in your area.  Spiritual beliefs and practices may help some smokers quit.  Download a "quit meter" on your computer to keep track of quit statistics, such as how long you have gone without smoking, cigarettes not smoked, and money saved.  Get a self-help book about quitting smoking and staying off tobacco. LEARN NEW SKILLS AND BEHAVIORS  Distract yourself from urges to smoke. Talk to someone, go for a walk, or occupy your time with a task.  Change your normal routine. Take a different route to work. Drink tea  instead of coffee. Eat breakfast in a different place.  Reduce your stress. Take a hot bath, exercise, or read a book.  Plan something enjoyable to do every day. Reward yourself for not smoking.  Explore interactive web-based programs that specialize in helping you quit. GET MEDICINE AND USE IT CORRECTLY Medicines can help you stop smoking and decrease the urge to smoke. Combining medicine with the above behavioral methods and support can greatly increase your chances of successfully quitting smoking.  Nicotine replacement therapy helps deliver nicotine to your body without the negative effects and risks of smoking. Nicotine replacement therapy includes nicotine gum, lozenges, inhalers,   nasal sprays, and skin patches. Some may be available over-the-counter and others require a prescription.  Antidepressant medicine helps people abstain from smoking, but how this works is unknown. This medicine is available by prescription.  Nicotinic receptor partial agonist medicine simulates the effect of nicotine in your brain. This medicine is available by prescription. Ask your health care provider for advice about which medicines to use and how to use them based on your health history. Your health care provider will tell you what side effects to look out for if you choose to be on a medicine or therapy. Carefully read the information on the package. Do not use any other product containing nicotine while using a nicotine replacement product.  RELAPSE OR DIFFICULT SITUATIONS Most relapses occur within the first 3 months after quitting. Do not be discouraged if you start smoking again. Remember, most people try several times before finally quitting. You may have symptoms of withdrawal because your body is used to nicotine. You may crave cigarettes, be irritable, feel very hungry, cough often, get headaches, or have difficulty concentrating. The withdrawal symptoms are only temporary. They are strongest when you  first quit, but they will go away within 10-14 days. To reduce the chances of relapse, try to:  Avoid drinking alcohol. Drinking lowers your chances of successfully quitting.  Reduce the amount of caffeine you consume. Once you quit smoking, the amount of caffeine in your body increases and can give you symptoms, such as a rapid heartbeat, sweating, and anxiety.  Avoid smokers because they can make you want to smoke.  Do not let weight gain distract you. Many smokers will gain weight when they quit, usually less than 10 pounds. Eat a healthy diet and stay active. You can always lose the weight gained after you quit.  Find ways to improve your mood other than smoking. FOR MORE INFORMATION  www.smokefree.gov  Document Released: 11/03/2001 Document Revised: 03/26/2014 Document Reviewed: 02/18/2012 ExitCare Patient Information 2015 [employer]. This information is not intended to replace advice given to you by your health care provider. Make sure you discuss any questions you have with your health care provider.  

## 2014-08-03 NOTE — Progress Notes (Signed)
    Postoperative Visit   History of Present Illness  Brent Patterson is a 63 y.o. year old male patient of Dr. Darrick Penna who is s/p right common and external iliac artery stenting as well as left external iliac artery stenting and right femoral to below-knee popliteal bypass with vein on 07/02/14.  He states that the pain in his right foot is significantly improved. Unfortunately he continues to smoke. He returns today with concern about right groin area incision separation that started 2 days ago.  He denies fever or chills. He admits to not elevating his legs much and he has right foot and right lower leg swelling, he is walking a considerable amount. The right leg medial incision is healing well. The patient notes resolution of lower extremity symptoms.  The patient is able to complete their activities of daily living.   AMB STATUS: Ambulatory with cane.  Physical Examination  Filed Vitals:   08/03/14 1458  BP: 133/71  Pulse: 63  Temp: 97.5 F (36.4 C)  TempSrc: Oral  Resp: 16  Height:  (1.753 m)  Weight: 157 lb 3.2 oz (71.305 kg)  SpO2: 100%   Body mass index is 23.2 kg/(m^2).  RLE: medial aspect incisions are healed, right groin incision with moderate degree of maceration but no drainage, no swelling, no purulence, minimal erythema, pedal pulses are 2+ palpable. No ulcers or ischemic changes in feet or legs. Right lower leg has moderate degree of swelling as patient admits to rarely elevating his legs. He was advised how to elevate his legs when not walking and was encouraged to walk as much as possible. He took a pamphlet re a free smoking cessation class and voiced motivation to quit smoking. He was encouraged re his motivation and counseled re smoking cessation.  Medical Decision Making  Brent Patterson is a 63 y.o. year old male who presents s/p right common and external iliac artery stenting as well as left external iliac artery stenting and right femoral to  below-knee popliteal bypass with vein on 07/02/14.   The patient's bypass incisions are healing appropriately with resolution of pre-operative symptoms.  After discussing with Dr. Imogene Burn, the patient will return on 08/15/14 for wound recheck by Dr. Darrick Penna (Dr. Darrick Penna is not in the office the week of September 14-18).  Continue to wash right groin incision daily with soap and water, apply Neosprin and folded 4x4 gauze daily until granulated. No evidence of infection.  I discussed in depth with the patient the nature of atherosclerosis, and emphasized the importance of maximal medical management including strict control of blood pressure, blood glucose, and lipid levels, obtaining regular exercise, and cessation of smoking.  The patient is aware that without maximal medical management the underlying atherosclerotic disease process will progress, limiting the benefit of any interventions. I emphasized the importance of routine surveillance of the patient's bypass, as the vascular surgery literature emphasize the improved patency possible with assisted primary patency procedures versus secondary patency procedures. The patient agrees to participate in their maximal medical care and routine surveillance.  Thank you for allowing [place] to participate in this patient's care.  NICKEL, Carma Lair, RN, MSN, FNP-C [facility] [place] Office: [phone]  08/03/2014, 3:08 PM  Clinic MD: Imogene Burn on call

## 2014-08-14 ENCOUNTER — Encounter: Payer: Self-pay | Admitting: Vascular Surgery

## 2014-08-15 ENCOUNTER — Ambulatory Visit: Payer: Commercial Managed Care - HMO | Admitting: Vascular Surgery

## 2014-08-22 ENCOUNTER — Encounter: Payer: Self-pay | Admitting: Vascular Surgery

## 2014-08-23 ENCOUNTER — Ambulatory Visit (INDEPENDENT_AMBULATORY_CARE_PROVIDER_SITE_OTHER): Payer: Commercial Managed Care - HMO | Admitting: Vascular Surgery

## 2014-08-23 ENCOUNTER — Encounter: Payer: Self-pay | Admitting: Vascular Surgery

## 2014-08-23 VITALS — BP 157/82 | HR 88 | Temp 97.6°F | Resp 18 | Ht 69.0 in | Wt 160.0 lb

## 2014-08-23 DIAGNOSIS — I70219 Atherosclerosis of native arteries of extremities with intermittent claudication, unspecified extremity: Secondary | ICD-10-CM

## 2014-08-23 DIAGNOSIS — Z48812 Encounter for surgical aftercare following surgery on the circulatory system: Secondary | ICD-10-CM

## 2014-08-23 NOTE — Progress Notes (Signed)
Patient is a 63 year old male who returns for followup today. He underwent right external and common iliac artery stenting as well as left external iliac artery stenting and right femoral to below-knee popliteal bypass with vein as well as a right femoral endarterectomy with profundoplasty all on July 02, 2014.  This was done for rest pain in his right foot. He no longer has pain in the foot. He was recently seen by her nurse practitioner for a poorly healing area in his right groin. He states he no longer has rest pain. He has no claudication in either leg. He has quit smoking for 10 days.  Physical exam:  Filed Vitals:   08/23/14 1033  BP: 157/82  Pulse: 88  Temp: 97.6 F (36.4 C)  TempSrc: Oral  Resp: 18  Height: 5\' 9"  (1.753 m)  Weight: 160 lb (72.576 kg)  SpO2: 98%    Extremities: Right groin well-healed except for the bottom 2 cm which has a superficial erosion less than 1 mm in depth 2 cm in length no expressible drainage all other incisions healed right foot pink warm good Doppler signal 3+ left femoral pulse absent pedal pulses left foot no ulcerations  Assessment: Slowly healing right groin wound with no evidence of infection  Plan: The patient will followup in light November with a graft duplex scan of bilateral ABIs. He will try to continue to refrain from smoking. He will placed just a Band-Aid over the erosion stop the Neosporin ointment at this point.  Fabienne Brunsharles Trigo Winterbottom, MD [facility] [place] Office: [phone] Pager: [phone]

## 2014-08-30 ENCOUNTER — Ambulatory Visit: Payer: Commercial Managed Care - HMO | Admitting: Vascular Surgery

## 2014-10-25 ENCOUNTER — Other Ambulatory Visit (HOSPITAL_COMMUNITY): Payer: Commercial Managed Care - HMO

## 2014-10-25 ENCOUNTER — Ambulatory Visit: Payer: Commercial Managed Care - HMO | Admitting: Vascular Surgery

## 2014-10-25 ENCOUNTER — Encounter (HOSPITAL_COMMUNITY): Payer: Commercial Managed Care - HMO

## 2014-11-01 ENCOUNTER — Encounter (HOSPITAL_COMMUNITY): Payer: Self-pay | Admitting: Surgery

## 2014-11-29 ENCOUNTER — Ambulatory Visit: Payer: Commercial Managed Care - HMO | Admitting: Vascular Surgery

## 2014-11-29 ENCOUNTER — Encounter (HOSPITAL_COMMUNITY): Payer: Commercial Managed Care - HMO

## 2014-11-29 ENCOUNTER — Other Ambulatory Visit (HOSPITAL_COMMUNITY): Payer: Commercial Managed Care - HMO

## 2014-12-20 ENCOUNTER — Other Ambulatory Visit (HOSPITAL_COMMUNITY): Payer: Commercial Managed Care - HMO

## 2014-12-20 ENCOUNTER — Encounter (HOSPITAL_COMMUNITY): Payer: Commercial Managed Care - HMO

## 2014-12-20 ENCOUNTER — Ambulatory Visit: Payer: Commercial Managed Care - HMO | Admitting: Vascular Surgery

## 2014-12-25 DIAGNOSIS — J069 Acute upper respiratory infection, unspecified: Secondary | ICD-10-CM | POA: Diagnosis not present

## 2014-12-25 DIAGNOSIS — D510 Vitamin B12 deficiency anemia due to intrinsic factor deficiency: Secondary | ICD-10-CM | POA: Diagnosis not present

## 2014-12-25 DIAGNOSIS — I739 Peripheral vascular disease, unspecified: Secondary | ICD-10-CM | POA: Diagnosis not present

## 2014-12-25 DIAGNOSIS — B351 Tinea unguium: Secondary | ICD-10-CM | POA: Diagnosis not present

## 2014-12-27 ENCOUNTER — Other Ambulatory Visit (HOSPITAL_COMMUNITY): Payer: Commercial Managed Care - HMO

## 2014-12-27 ENCOUNTER — Encounter (HOSPITAL_COMMUNITY): Payer: Commercial Managed Care - HMO

## 2014-12-27 ENCOUNTER — Ambulatory Visit: Payer: Commercial Managed Care - HMO | Admitting: Vascular Surgery

## 2015-07-18 DIAGNOSIS — D510 Vitamin B12 deficiency anemia due to intrinsic factor deficiency: Secondary | ICD-10-CM | POA: Diagnosis not present

## 2015-07-18 DIAGNOSIS — E1165 Type 2 diabetes mellitus with hyperglycemia: Secondary | ICD-10-CM | POA: Diagnosis not present

## 2015-07-18 DIAGNOSIS — Z1389 Encounter for screening for other disorder: Secondary | ICD-10-CM | POA: Diagnosis not present

## 2015-08-27 DIAGNOSIS — I6529 Occlusion and stenosis of unspecified carotid artery: Secondary | ICD-10-CM | POA: Diagnosis not present

## 2015-08-27 DIAGNOSIS — Z1389 Encounter for screening for other disorder: Secondary | ICD-10-CM | POA: Diagnosis not present

## 2015-08-27 DIAGNOSIS — E1165 Type 2 diabetes mellitus with hyperglycemia: Secondary | ICD-10-CM | POA: Diagnosis not present

## 2015-09-05 DIAGNOSIS — I6529 Occlusion and stenosis of unspecified carotid artery: Secondary | ICD-10-CM | POA: Diagnosis not present

## 2015-09-05 DIAGNOSIS — I6523 Occlusion and stenosis of bilateral carotid arteries: Secondary | ICD-10-CM | POA: Diagnosis not present

## 2015-10-25 ENCOUNTER — Encounter: Payer: Self-pay | Admitting: Vascular Surgery

## 2015-10-31 ENCOUNTER — Ambulatory Visit: Payer: Medicare HMO | Admitting: Family

## 2015-11-05 IMAGING — CR DG CHEST 2V
2 series · 2 of 2 positions shown · non-contrast
Comparison: 12/24/2007

CLINICAL DATA: Diabetes, hypertension, smoker. Right side bypass
graft.

EXAM:
CHEST  2 VIEW

[w chest pa]
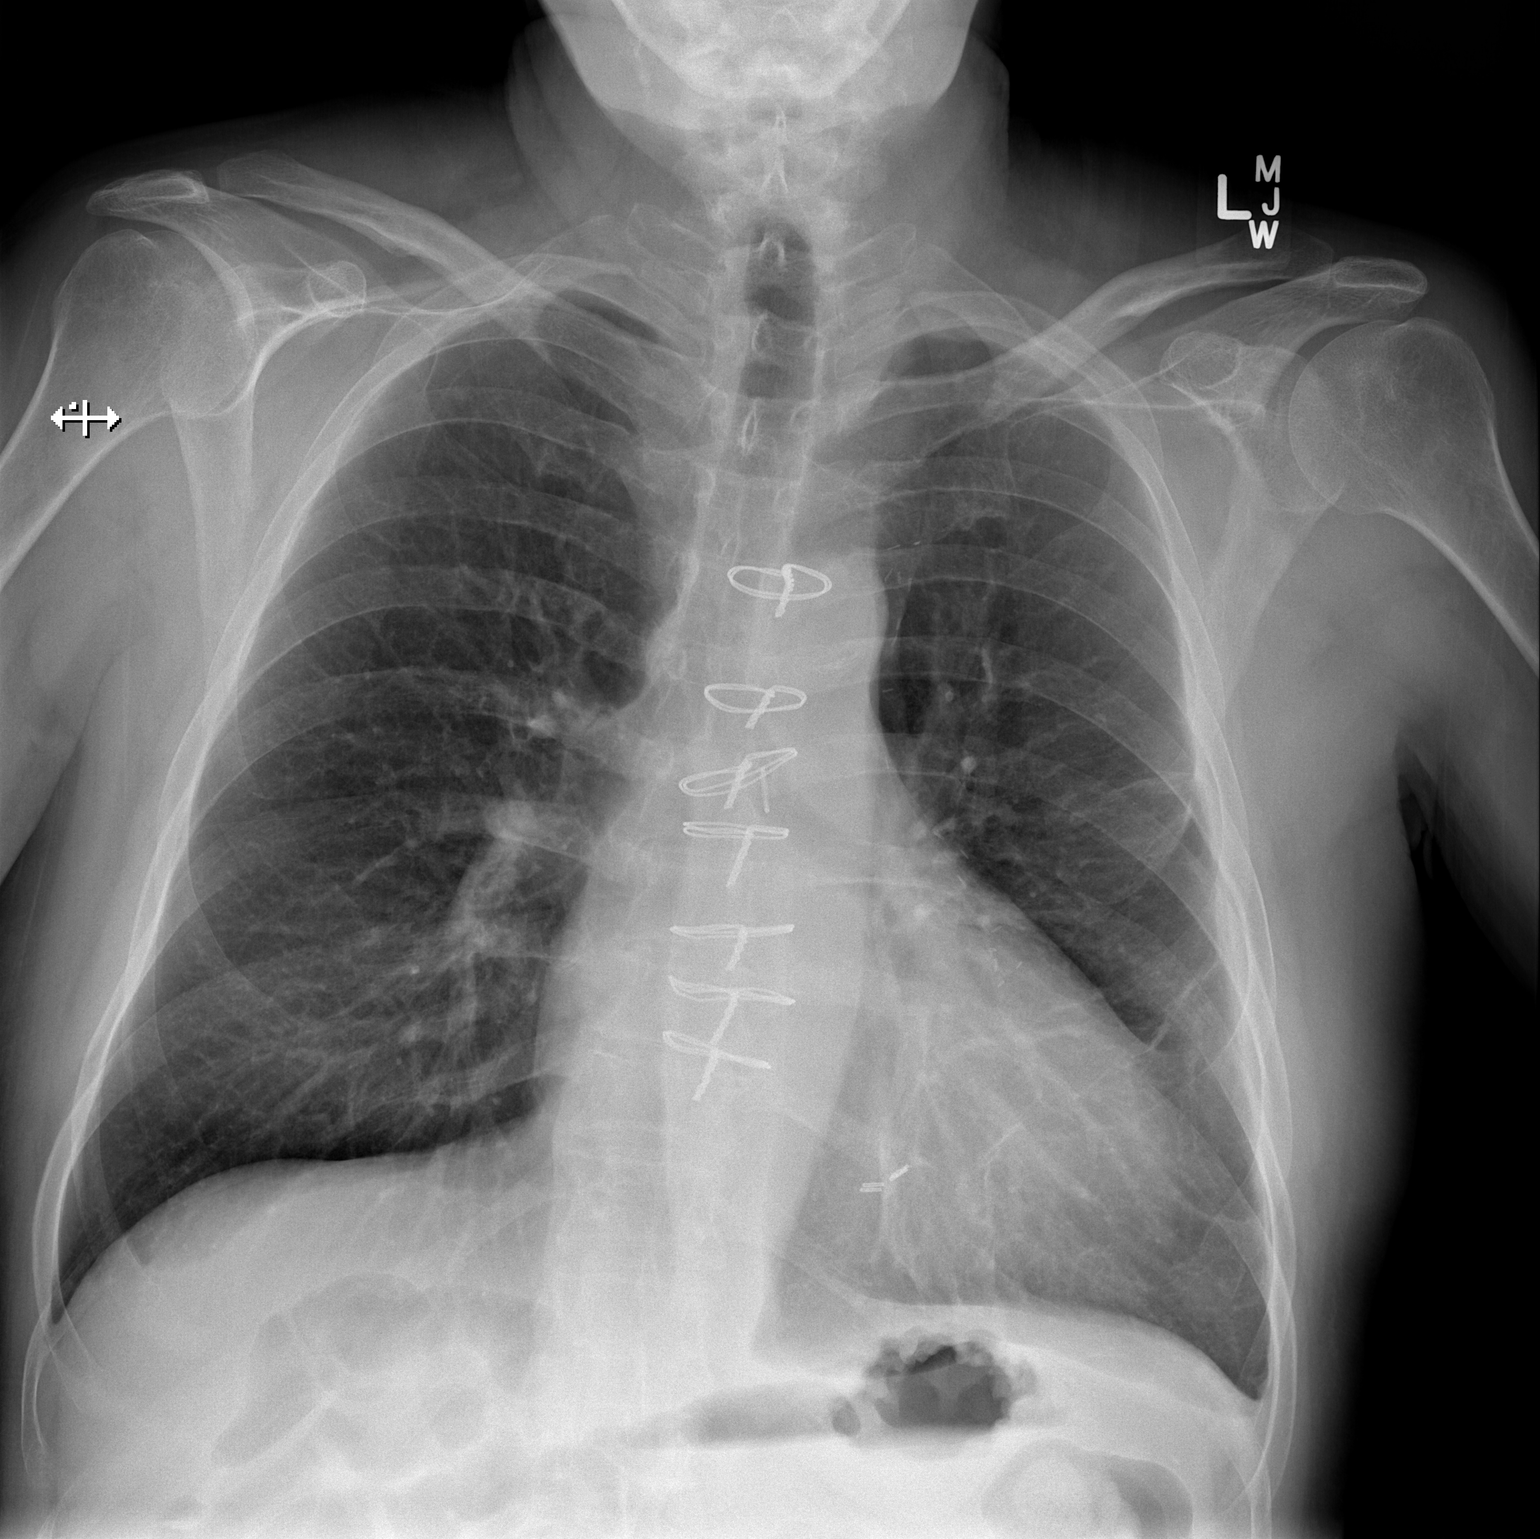

[w chest lat]
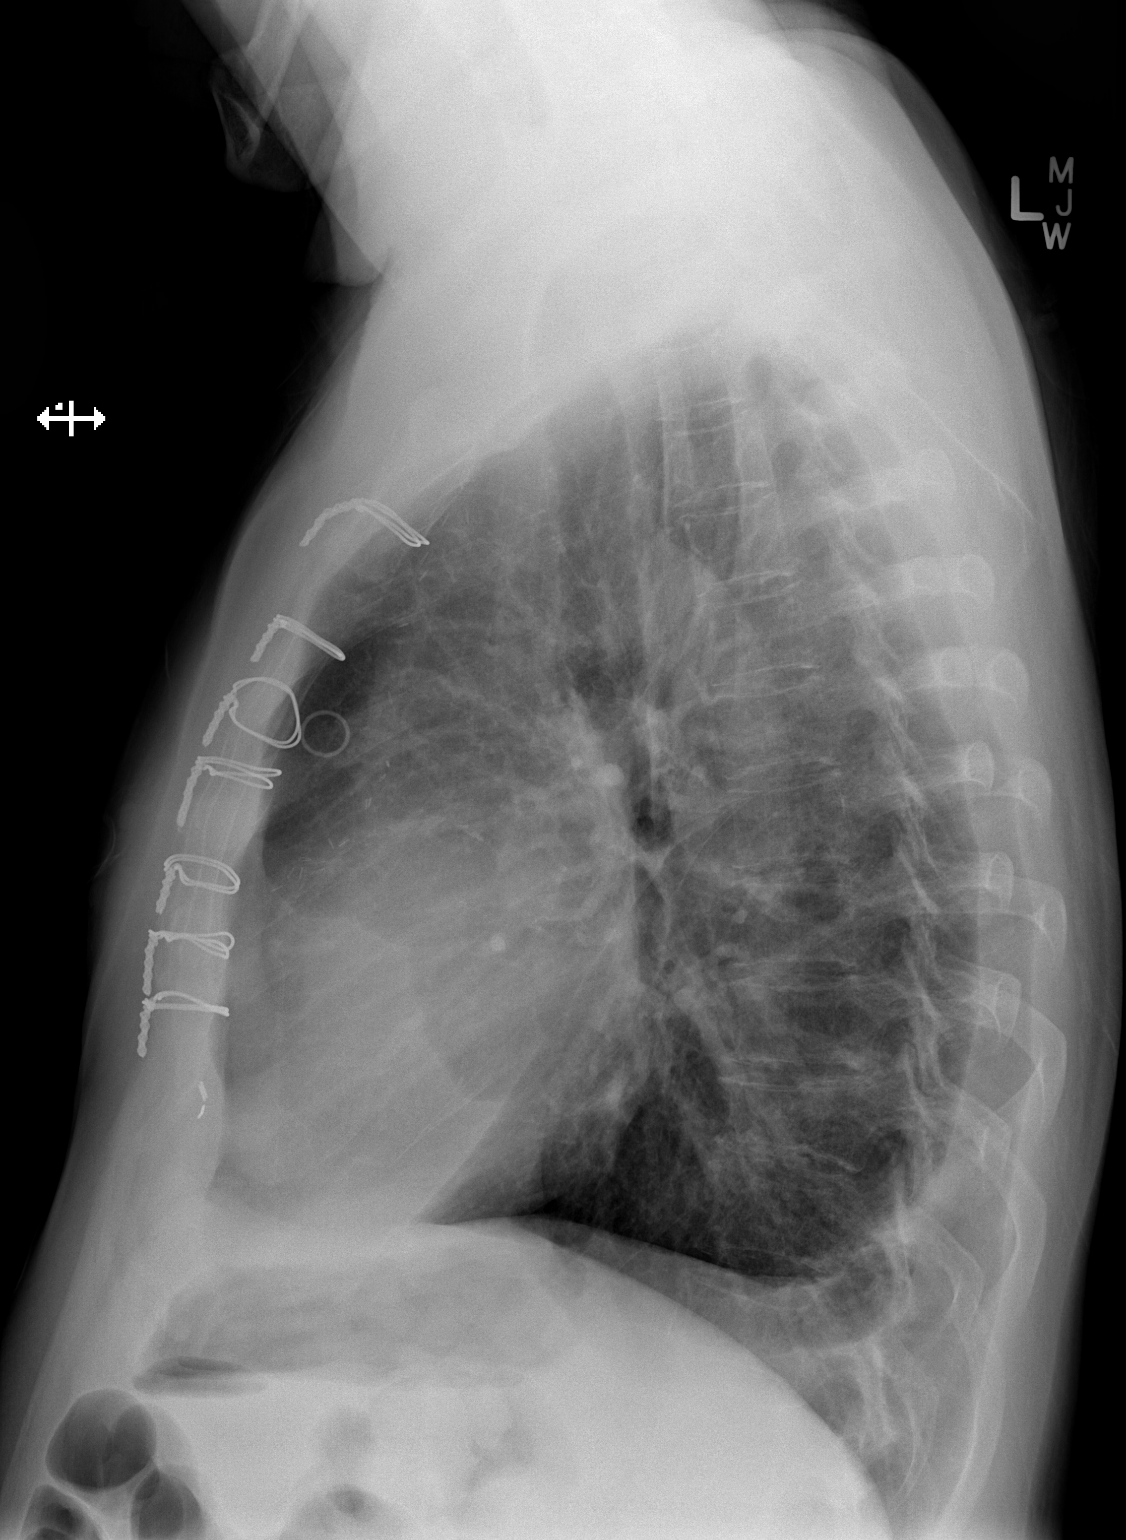

[2 of 2 positions shown; findings below may reference images not displayed]

FINDINGS: The heart size is upper normal to mildly enlarged. Mediastinal
contours otherwise within normal range. Status post median
sternotomy and CABG. Right hemidiaphragm elevation. Mild blunted
costophrenic angles reflect pleural thickening or fluid.
IMPRESSION: Heart size upper normal to mildly enlarged.  No focal consolidation.

## 2015-12-03 ENCOUNTER — Other Ambulatory Visit: Payer: Self-pay | Admitting: *Deleted

## 2015-12-03 DIAGNOSIS — E1110 Type 2 diabetes mellitus with ketoacidosis without coma: Secondary | ICD-10-CM

## 2015-12-03 NOTE — Patient Outreach (Signed)
[facility]) Care Management  12/03/2015  Brent Patterson 04-May-1951 [id number]  Subjective: Telephone call to patient's home number, spoke with patient, and HIPAA verified.   Patient states he is doing good.   Discussed [facility] Care Management services.   Patient verbalized understanding and is in agreement to receive [facility] Care Management services.    Patient states he has not scheduled MD following appointment for primary MD Dr. Gwendlyn Deutscher II since last office visit on 08/27/15.  Patient aware and verbalized understanding that MD had requested patient to follow up with MD within 6 -8 weeks after 08/27/15 office visit for diabetes and hypertension concerns.   States he is planning to schedule sometimes in February of 2017 and will let RNCM know if coordination assistance is needed.   RNCM educated patient on the importance of MD follow up and advised [facility] RNCM can assist with appointment coordination.     Patient states he had to cancel last appointment with Dr. Fabienne Bruns for vascular follow up and is in the process of rescheduling appointment.   Patient states he will notify [facility] RNCM if assistance is needed with appointment coordination. States he has resolved his transportation issue and has a friend that can transport to appointments as needed.  Patient states he is need of education regarding diabetes, diabetes management, care coordination for MD follow up appointments, depression screening follow up, blood pressure monitoring,  and is in agreement to receiving [facility] home visits.  Patient states he test his blood glucose twice a week, last reading was 134 and it was taken on 11/30/15.   Patient states he is currently not on insulin, only taking diabetes pill and goal is not to be on insulin.  Patient states he is need of depression screening follow up and is in agreement to receiving [facility] Social Worker follow up. Patient states he is also in agreement that [facility] sharing  depression screening tool results with Dr. Gwendlyn Deutscher.  Patient states he is currently not receiving medication or counseling for depression and does not wish to a this time.   Patient states he is taking approximately 12 medications, is able to afford medications, and is in agreement to receive [facility] follow up for medication review.   Patient states he has his flu shot in January or February of 2016 and is planning to get it during this flu season.  Patient in agreement to continue to receive [facility] Care Management services.   RNCM verbal gave patient RNCM's contact number, [facility] main phone number and the 24 hour Nurse Advice phone number.     Objective: Per Epic case review.  Patient is an active smoker, has COPD, diabetes, hypertension, peripheral vascular disease, and chronic kidney disease.  Per last primary care MD (Dr. Gwendlyn Deutscher II) office visit note on 08/27/15, patient not meeting A1C or blood pressure goal and MD requested patient to follow up in 6 - 8 weeks.    Assessment:  [employer] Tier 4 list referral received: 12/03/15.   Referral date: 11/26/15.  PHQ 2 and 9 Depression Screen completed, patient score 8 = mild depression.  Patient in need of education regarding diabetes, diabetes management, care coordination for MD follow up appointments, depression screening follow up, blood pressure monitoring, blood pressure education, medication review, and is in agreement to receiving [facility] Care Management service.   Patient has no [facility] Telephonic RNCM needs at this time.     Plan: RNCM will refer patient to  [facility] RNCM for education regarding diabetes, diabetes management, care coordination for MD follow up appointments, depression screening follow up, blood pressure education, and blood pressure monitoring. RNCM will [facility] for [facility]. RNCM will refer patient to [facility] Social Worker for depression screening follow up.  Bellamarie Pflug H. Gardiner Barefootooper RN, BSN, CCM [facility] Care  Management [facility] Telephonic CM Phone: [phone] Fax: [phone]

## 2015-12-05 ENCOUNTER — Other Ambulatory Visit: Payer: Self-pay

## 2015-12-05 NOTE — Patient Outreach (Signed)
New community referral for DM management:  Placed call to patient. No answer. Left a message requesting a call back.  PLAN: Will continue to outreach patient.  Rowe PavyAmanda Errik Mitchelle, RN, BSN, CEN [facility] Care Coordinator [phone]

## 2015-12-06 ENCOUNTER — Encounter: Payer: Self-pay | Admitting: *Deleted

## 2015-12-06 ENCOUNTER — Other Ambulatory Visit: Payer: Self-pay

## 2015-12-06 ENCOUNTER — Other Ambulatory Visit: Payer: Self-pay | Admitting: *Deleted

## 2015-12-06 NOTE — Patient Outreach (Signed)
Care coordination: Attempted to reach patient. No answer.  PLAN: Will continue to outreach attempts.  Rowe PavyAmanda Iyani Dresner, RN, BSN, CEN [facility] Care Coordinator [phone]

## 2015-12-06 NOTE — Patient Outreach (Signed)
[facility]) Care Management  12/06/2015  Brent ClaymanHoward G Recendez 07-17-1951 [id number]   CSW received a new referral on patient from Elmer Pickerarlene Cooper, [facility] with [facility] Care Management, indicating that patient would benefit from social work services and resources to assist with symptoms of Depression.  More specifically, Mrs. Excell SeltzerCooper reported that patient scored an 8 (mild Depression) on his PHQ 2 & PHQ 9 Depression Screenings. CSW made an initial attempt to try and contact patient today to perform phone assessment, as well as assess and assist with social needs and services, without success.  A HIPAA complaint message was left for patient on voicemail.  CSW is currently awaiting a return call.  Danford BadJoanna Cyra Spader, BSW, MSW, LCSW  Licensed Restaurant manager, fast foodClinical Social Worker  [facility] Care Management  System  Mailing [address], [place], [place] [address] Physical Address-[address], [place], [place] [address] Toll Free Main # [phone] Fax # [phone] Cell # [phone]  Fax # [phone]  [email or web address]

## 2015-12-09 ENCOUNTER — Other Ambulatory Visit: Payer: Self-pay

## 2015-12-09 ENCOUNTER — Other Ambulatory Visit: Payer: Self-pay | Admitting: *Deleted

## 2015-12-09 NOTE — Patient Outreach (Signed)
[facility]) Care Management  12/09/2015  Brent Patterson [birth year] [id number]   CSW made a second attempt to try and contact patient today to perform phone assessment, as well as assess and assist with social needs and services, without success.  A HIPAA complaint message was left for patient on voicemail.  CSW is currently awaiting a return call.  Brent Patterson, BSW, MSW, LCSW  Licensed Restaurant manager, fast foodClinical Social Worker  [facility] Care Management [place] System  Mailing [address], [place], [place] [address] Physical Address-[address], [place], [place] [address] Toll Free Main # [phone] Fax # [phone] Cell # [phone]  Fax # [phone]  [name]

## 2015-12-09 NOTE — Patient Outreach (Signed)
Care Coordination: Placed call to patient and explained reason for call. Patient expressed interested in home visit to assist with DM education.   PLAN: Offered home visit for 12/19/2015. Patient accepted. Confirmed address. Provided my contact information. Will see patient on 12/09/2015 for home visit.  Rowe PavyAmanda Behr Cislo, RN, BSN, CEN [facility] Care Coordinator [phone]

## 2015-12-17 ENCOUNTER — Other Ambulatory Visit: Payer: Self-pay

## 2015-12-17 NOTE — Patient Outreach (Signed)
12/17/2015  Subjective:  Mr. Babington is a 65 year old male who was referred to me for medication review.  He has a past medical history of diabetes, PAD, CAD, hyperlipidemia, and hypertension.    Objective:  Drugs sorted by system:  Neurologic/Psychologic: escitaloprim, gabapentin, trazodone  Cardiovascular: amlodipine, aspirin, nitrostat, pravastatin, lisinopril  Pulmonary/Allergy: none  Gastrointestinal: ranitidine  Endocrine: glimepiride, sitagliptin,   Renal: none  Topical: none  Pain: none  Vitamins/Minerals: none  Infectious Diseases: none  Miscellaneous: tamsulosin   Duplications in therapy: none Gaps in therapy: none Medications to avoid in the elderly: none Drug interactions: none Other issues noted: none   Plan:  1.  No major drug interactions or problems noted.  I will close patient to pharmacy since medication review completed and pharmacy issues noted.    Steve Rattler, PharmD, [employer] [facility] Environmental consultant [phone]

## 2015-12-19 ENCOUNTER — Other Ambulatory Visit: Payer: Self-pay | Admitting: *Deleted

## 2015-12-19 ENCOUNTER — Encounter: Payer: Self-pay | Admitting: *Deleted

## 2015-12-19 ENCOUNTER — Other Ambulatory Visit: Payer: Self-pay

## 2015-12-19 NOTE — Patient Outreach (Signed)
[facility]) Care Management   12/19/2015  Brent Patterson 1951/02/23 [id number]  Brent Patterson is an 65 y.o. male 10:15 am Arrived for home visit. Patient answered door with problems. Subjective:  Patient reports that he feels like his biggest struggle is managing his diabetes. Reports that he uses a neighbors CBG meter once a week. He reports that he does not know if he is supposed to check his blood sugar at home. Reports the he goes to his doctors when he has his copays. Patient reports that he will [facility] and make an appointment next week.  Patent reports that he is currently smoking 1 pack per day and would like to quit.  Patient reports that he lives with his sister and sister also smokes.  Patient reports that he checks his blood pressure occasionally and reports range of 150-160/ 90's.   Patient reports that he follows a low salt diet but does not follow a diabetic diet. Reports that he uses a sugar substitute for his coffee. Reports that he drinks sweet ice tea occasionally. Reports eating white bread and potatoes.   Patient reports that he is able to drive to appointments. Able to ambulate and bathe without difficulty. Patient reports he is able to manage his medications without problems.      Objective:  Awake and alert. Home smells of smoke. Able to ambulate without difficulty.  Filed Vitals:   12/19/15 1028 12/19/15 1120  BP: 162/98 158/90  Pulse: 66   Resp: 18   Height: 1.753 m ( )   Weight: 167 lb (75.751 kg)   SpO2: 98%    Review of Systems  Constitutional: Negative.   HENT: Negative.   Eyes: Negative.   Respiratory: Positive for shortness of breath.        Reports shortness of breath when walking up a hill  Cardiovascular: Negative.   Gastrointestinal: Negative.   Genitourinary: Negative.   Musculoskeletal: Negative.   Skin: Negative.   Endo/Heme/Allergies: Bruises/bleeds easily.  Psychiatric/Behavioral: Positive for depression.     Physical Exam  Constitutional: He is oriented to person, place, and time. He appears well-developed and well-nourished.  Cardiovascular: Normal rate.   Respiratory: Effort normal and breath sounds normal.  Lungs clear. No distress. Talking in complete sentences  GI: Soft. Bowel sounds are normal.  Musculoskeletal: Normal range of motion. He exhibits no edema.  Neurological: He is alert and oriented to person, place, and time.  Skin: Skin is warm and dry.  Feet cool to touch. Pulses present in both feet. Skin intact to both feet.  Patient able to self inspect his own feet.  Psychiatric: He has a normal mood and affect. His behavior is normal. Judgment and thought content normal.    Current Medications:   Current Outpatient Prescriptions  Medication Sig Dispense Refill  . aspirin 81 MG tablet Take 1 tablet (81 mg total) by mouth daily. 30 tablet 0  . atorvastatin (LIPITOR) 20 MG tablet Take 20 mg by mouth at bedtime.    . carvedilol (COREG) 3.125 MG tablet Take 3.125 mg by mouth 2 (two) times daily with a meal.    . furosemide (LASIX) 40 MG tablet Take 40 mg by mouth 1 day or 1 dose.    . gabapentin (NEURONTIN) 100 MG capsule Take 200 mg by mouth 2 (two) times daily.    Marland Kitchen glimepiride (AMARYL) 2 MG tablet Take 1 mg by mouth 2 (two) times daily.     . nitroGLYCERIN (  NITROSTAT) 0.4 MG SL tablet Place 0.4 mg under the tongue every 5 (five) minutes as needed for chest pain.    . ranitidine (ZANTAC) 150 MG tablet Take 150 mg by mouth daily as needed for heartburn.     . terbinafine (LAMISIL) 250 MG tablet Take 250 mg by mouth daily.    Marland Kitchen amLODipine (NORVASC) 10 MG tablet Take 10 mg by mouth every morning. Reported on 12/19/2015    . escitalopram (LEXAPRO) 10 MG tablet Take 10 mg by mouth every evening. Reported on 12/19/2015    . glimepiride (AMARYL) 2 MG tablet Take 2 mg by mouth 2 (two) times daily. Reported on 12/19/2015    . lisinopril (PRINIVIL,ZESTRIL) 10 MG tablet Take 10 mg by mouth  daily. Reported on 12/19/2015    . lisinopril (PRINIVIL,ZESTRIL) 20 MG tablet Take 20 mg by mouth every evening. Reported on 12/19/2015    . pravastatin (PRAVACHOL) 40 MG tablet Take 40 mg by mouth every evening. Reported on 12/19/2015    . sitaGLIPtin (JANUVIA) 100 MG tablet Take 50 mg by mouth every evening. Reported on 12/19/2015    . tamsulosin (FLOMAX) 0.4 MG CAPS capsule Take 1 capsule (0.4 mg total) by mouth daily. (Patient not taking: Reported on 12/19/2015) 30 capsule 1  . traZODone (DESYREL) 100 MG tablet Take 100 mg by mouth at bedtime as needed for sleep. Reported on 12/19/2015     No current facility-administered medications for this visit.    Functional Status:   In your present state of health, do you have any difficulty performing the following activities: 12/19/2015 12/19/2015  Hearing? N N  Vision? Y N  Difficulty concentrating or making decisions? N N  Walking or climbing stairs? N N  Dressing or bathing? N N  Doing errands, shopping? N N  Preparing Food and eating ? N N  Using the Toilet? N N  In the past six months, have you accidently leaked urine? - N  Do you have problems with loss of bowel control? N N  Managing your Medications? N N  Managing your Finances? N N  Housekeeping or managing your Housekeeping? N N    Fall/Depression Screening:    PHQ 2/9 Scores 12/19/2015 12/19/2015 12/03/2015  PHQ - 2 Score PHQ- 9 Score Fall Risk  12/19/2015 12/19/2015 12/03/2015  Falls in the past year? No No No    Assessment:  (1) Explained [facility] program. Answered all questions. Provided new patient packet including [employer] non discrimination handout. Provided calendar, magnet and RN contact information. (2) BP high.  Reports home BP range of 150-160/ 90's (3) Not checking CBG's . No meter. Reading today of 221 non fasting. (4) no advanced directives (5) positive depression screening. Not on any medication for depression. (6) gets meds from local [facility]. (7)No  follow up MD appointment (8) continues to smoke  Plan: (1)Consent obtained and scanned into ON BASE. (2)reports taking his meds as prescribed. Will send this note to MD with current blood pressure readings at this time home. Encouraged low salt diet, Home BP monitoring and recording. (3) will inquire with MD if patient needs to home monitor and follow up with patient with answer from MD. Provided Carb tear off education material, [facility] DM packet and EMMI education. Patient has agreed to read material. (4) Patient not interested in advanced directive packet. (5) Will send this screening to MD and inform MD patient is no longer taking meds for depression.  [facility] social worker notified via in basket message. (6) Encouraged patient to call [employer] to inquire about mail order pharmacy to assist with medication cost. Patient will consider this option. (7) Offered to call MD office and make follow up appointment and patient declined an states he can do it himself when he has the money for the copay. (8) Interested in smoking cessation.  Will provide EMMI education and smoking cessation information at next home visit planned for 01/09/2016  Next home visit planned for 01/09/2016. Will call patient with update about CBG machine and monitoring after gathering MD recommendations.  Collaborative goal setting and care planning and number 1 goal at this time is to manage his DM better.   [facility] CM Care Plan Problem One        Most Recent Value   Care Plan Problem One  Knowledge deficit related to DM   Role Documenting the Problem One  Care Management Coordinator   Care Plan for Problem One  Active   [facility] Long Term Goal (31-90 days)  Patient will verbalize improved understanding of DM diet. in teh next 31 days   [facility] Long Term Goal Start Date  12/19/15   Interventions for Problem One Long Term Goal  Home visit completed. Checked CBG today during home visit and reported to MD. Encouraged patient to read handouts.  Reviewed with patient foods to limit. like sugar, candy ,pies, cake.   [facility] CM Short Term Goal #1 (0-30 days)  Patient will make and attend primary MD appointment in the next 3 weeks.   [facility] CM Short Term Goal #1 Start Date  12/19/15   Interventions for Short Term Goal #1  Offered to make appointment for patient and he declined. Reviewed importance of close follow up with primary MD.,   [facility] CM Short Term Goal #2 (0-30 days)  Patient will home monitor and record his blood pressure 3 times per week for the next 3 weeks .   [facility] CM Short Term Goal #2 Start Date  12/19/15   Interventions for Short Term Goal #2  Reviewed importance of monitoring BP and informing MD for abnormal readings. Reviewed low salt diet. Provided [facility] calendar and where to record BP readings.           Rowe Pavy, RN, BSN, CEN [facility] [phone]

## 2015-12-19 NOTE — Patient Outreach (Signed)
[facility]) Care Management  12/19/2015  Brent Patterson [birth year] [id number]   CSW was able to make initial contact with Brent Patterson today to perform a brief phone assessment, as well as assess and assist with social work needs and services.  CSW introduced self, explained role and types of services provided through [facility] ([facility]).  CSW further explained to Brent Patterson that CSW works with Brent Patterson's RNCM, also with [facility], Tomasa Rand. CSW then explained the reason for the call, indicating that Brent Patterson thought that Brent Patterson would benefit from social work services and resources to assist with counseling and supportive services for Brent Patterson's symptoms of Depression.  CSW obtained two HIPAA compliant identifiers from Brent Patterson, which included Brent Patterson's name and date of birth. CSW explained Brent Patterson's depression score to him, related to the PQ2 & 9 Depression Screenings.  Brent Patterson admitted, "Getting down from time-to-time".  CSW inquired as to whether or not Brent Patterson is currently seeking medication management and/or psychotherapeutic services.  Brent Patterson denied; however, CSW is aware that Brent Patterson is currently taking psychotropic medications, Escitalopram (Lexapro) 54m, Gabapentin (Neurontin) 1086m Trazadone (Desyrel) 10038mafter thorough review of Brent Patterson's EMR (Electronic Medical Record) in EPIC.  Brent Patterson reported that he is not at all interested in counseling services, but requested that [name] him information on Depression.  CSW agreed to mail information to Brent Patterson today and then follow-up with Brent Patterson in one week to ensure that the information was received.  CSW confirmed with Brent Patterson that CSW has the correct address. Brent Patterson denied feeling homicidal or suicidal at present.  Brent Patterson believes that he has good coping mechanisms in place, which include walking, talking with his sister, watching television to distract his thoughts, etc.   Brent Patterson denied having any additional social work needs, reporting that he is really only interested in getting information about Diabetes.  CSW reminded Brent Patterson that he has an appointment scheduled today with AmaTomasa RandNCM with [facility], to discuss disease management services.  CSW will mail EMMI appropriate information (Depression and Diabetes) to Brent Patterson's home. CSW will perform a case closure on Brent Patterson, as all goals of treatment have been met from social work standpoint and no additional social work needs have been identified at this time. CSW will notify Brent Patterson's RNCM with [facility], AmaTomasa Rand CSW's plans to close Brent Patterson's case. CSW will fax a correspondence letter to Brent Patterson's Primary Care Physician, Dr. JohLovette Cliche ensure that Dr. RedLin Landsman aware of CSW's involvement with Brent Patterson. CSW will submit a case closure request to LisLurline Delare Management Assistant with [facility], in the form of an In BasSafeco CorporationCSW will ensure that Mrs. MooLaurance Flatten aware of AmaMarline BackboneNCM with [facility], continued involvement with Brent Patterson's care. JoaNat ChristenSW, MSW, LCSW  Licensed CliEducation officer, environmentalalth System  Mailing AddMcLend[address]vere[address] HeightsC [id number] Address[address] [id number] # [phone] # [phone] # [email or web address] with appLiberty Mutualvil rights laws and does not discriminate on the basis of race, color, national origin, age, disability, or sex.  Espaol (Spanish)  Tri[place] Springsmple con las leyes federales de derechos civiles aplicables y no discrimina por motivos de raza, color, nacionalidad, edad, discapacidad o sexo.  [place] Vi[place]t ([place])  [place] tun th? lu?t dn quy?n hi?n hnh c?a Lin bang v khng phn bi?t ?i x? d?a trn ch?ng t?c, mu da, ngu?n g?c qu?c gia, ? tu?i, khuy?t t?t, ho?c gi?i tnh.     (Arabic)

## 2015-12-23 ENCOUNTER — Other Ambulatory Visit: Payer: Self-pay

## 2015-12-23 NOTE — Patient Outreach (Signed)
Care Coordination: Spoke with Teena Dunk at [facility] regarding concerns about blood pressure and whether patient needs to monitor CBG.  Mrs. Chilton Si will send Toney Reil and Dr. Jeanie Sewer a message and they will follow up with patient.  Rowe Pavy, RN, BSN, CEN [facility] [employer] [phone]

## 2016-01-09 ENCOUNTER — Other Ambulatory Visit: Payer: Self-pay

## 2016-01-09 ENCOUNTER — Ambulatory Visit: Payer: Self-pay

## 2016-01-09 NOTE — Patient Outreach (Signed)
Routine home visit/no show:  10:00 Arrived for scheduled home visit. No answer at the door. Placed 2 calls to patient and I was able to hear phone ring inside but no answer.   Left my business card on door.    PLAN: Will outreach patient for follow up in 1 week.  Rowe Pavy, RN, BSN, CEN [facility] [employer] [phone]

## 2016-01-15 ENCOUNTER — Other Ambulatory Visit: Payer: Self-pay

## 2016-01-15 NOTE — Patient Outreach (Signed)
Follow up call from missed home visit: Placed call to patient. Family answered and reports that patient is out of town at sister. States that he will be back in a week.   PLAN: Will call patient in 1 week.  Rowe Pavy, RN, BSN, CEN [facility] [employer] [phone]

## 2016-01-24 ENCOUNTER — Other Ambulatory Visit: Payer: Self-pay

## 2016-01-24 NOTE — Patient Outreach (Signed)
Care coordination: Follow up call to patient after missed appointment on 01/09/2016  Patient reports to me he was out of state at his sisters house and forgot appointment. Reports that he did get a CBG meter and is monitoring his CBG.    Reports unable to talk long on phone because he is getting a new phone line put in today. Offered home visit and patient accepted. Will see patient on 01/31/2016 at 10:30 am   Rowe PavyAmanda Cook, RN, BSN, [facility] [employer] Care Coordinator [phone]

## 2016-01-31 ENCOUNTER — Ambulatory Visit: Payer: Self-pay

## 2016-01-31 ENCOUNTER — Other Ambulatory Visit: Payer: Self-pay

## 2016-01-31 NOTE — Patient Outreach (Signed)
Care Coordiantion/Home visit/ no show Drove to home for scheduled home visit. No answer at the door. Placed call to patient. No answer. Left card in door requesting patient call back.  PLAN: Will attempt to reach patient within 1 week.  NOTE:  This is patient second no show for home visit in a row.   Rowe PavyAmanda Cook, RN, BSN, CEN [facility] [employer] IncCommunity Care Coordinator [phone]

## 2016-02-03 ENCOUNTER — Other Ambulatory Visit: Payer: Self-pay

## 2016-02-03 NOTE — Patient Outreach (Signed)
Care coordination: Placed call to patient. No answer.  Patient was a no show for scheduled home visit on 01/31/2016.  PLAN: Will continue to outreach.  Rowe PavyAmanda Karron Goens, RN, BSN, CEN [facility] [employer] Care Coordinator [phone]

## 2016-02-06 ENCOUNTER — Other Ambulatory Visit: Payer: Self-pay

## 2016-02-06 NOTE — Patient Outreach (Signed)
Care Coordination: No show on 01/31/2016 for scheduled home visit. No answer for telephone outreach on 02/03/2016 No answer for telephone outreach today on 02/06/2016.  PLAN: Will mail letter. If no response in 10 days will close.  Rowe PavyAmanda Quinta Eimer, RN, BSN, CEN [facility] [employer]Community Care Coordinator [phone]

## 2016-02-21 ENCOUNTER — Other Ambulatory Visit: Payer: Self-pay

## 2016-02-21 ENCOUNTER — Ambulatory Visit: Payer: Self-pay

## 2016-02-21 NOTE — Patient Outreach (Signed)
Case closure: Patient was a no show for 2 scheduled home visit. Multiple attempts to outreach since no shows without success. No response to letter sent in attempt to engaged.  PLAN: Will close case as patient has withdrawn from services.            Will notify MD.             Will send patient case closure letter.  Rowe PavyAmanda Ashle Stief, RN, BSN, CEN [facility] [employer]Community Care Coordinator [phone]

## 2016-03-24 DIAGNOSIS — D510 Vitamin B12 deficiency anemia due to intrinsic factor deficiency: Secondary | ICD-10-CM | POA: Diagnosis not present

## 2016-03-24 DIAGNOSIS — R9431 Abnormal electrocardiogram [ECG] [EKG]: Secondary | ICD-10-CM | POA: Diagnosis not present

## 2016-03-24 DIAGNOSIS — E785 Hyperlipidemia, unspecified: Secondary | ICD-10-CM | POA: Diagnosis not present

## 2016-03-24 DIAGNOSIS — I499 Cardiac arrhythmia, unspecified: Secondary | ICD-10-CM | POA: Diagnosis not present

## 2016-03-24 DIAGNOSIS — Z79899 Other long term (current) drug therapy: Secondary | ICD-10-CM | POA: Diagnosis not present

## 2016-03-24 DIAGNOSIS — E1165 Type 2 diabetes mellitus with hyperglycemia: Secondary | ICD-10-CM | POA: Diagnosis not present

## 2016-03-24 DIAGNOSIS — R5383 Other fatigue: Secondary | ICD-10-CM | POA: Diagnosis not present

## 2016-04-24 ENCOUNTER — Encounter: Payer: Self-pay | Admitting: Cardiology

## 2016-06-08 ENCOUNTER — Encounter: Payer: Self-pay | Admitting: Cardiology

## 2016-07-28 DIAGNOSIS — Z23 Encounter for immunization: Secondary | ICD-10-CM | POA: Diagnosis not present

## 2016-07-28 DIAGNOSIS — E1165 Type 2 diabetes mellitus with hyperglycemia: Secondary | ICD-10-CM | POA: Diagnosis not present

## 2016-07-28 DIAGNOSIS — Z0000 Encounter for general adult medical examination without abnormal findings: Secondary | ICD-10-CM | POA: Diagnosis not present

## 2016-07-28 DIAGNOSIS — D510 Vitamin B12 deficiency anemia due to intrinsic factor deficiency: Secondary | ICD-10-CM | POA: Diagnosis not present

## 2016-09-15 ENCOUNTER — Encounter: Payer: Self-pay | Admitting: Cardiology

## 2016-09-28 DIAGNOSIS — E1165 Type 2 diabetes mellitus with hyperglycemia: Secondary | ICD-10-CM | POA: Diagnosis not present

## 2016-09-28 DIAGNOSIS — I739 Peripheral vascular disease, unspecified: Secondary | ICD-10-CM | POA: Diagnosis not present

## 2016-12-24 DEATH — deceased
